# Patient Record
Sex: Female | Born: 1960 | Race: White | Hispanic: No | Smoking: Former smoker
Health system: Southern US, Community
[De-identification: ages and names within clinical notes are randomized; demographics above are authoritative.]

## PROBLEM LIST (undated history)

## (undated) DIAGNOSIS — F32A Depression, unspecified: Secondary | ICD-10-CM

## (undated) DIAGNOSIS — T7840XA Allergy, unspecified, initial encounter: Secondary | ICD-10-CM

## (undated) DIAGNOSIS — I10 Essential (primary) hypertension: Secondary | ICD-10-CM

## (undated) DIAGNOSIS — M1990 Unspecified osteoarthritis, unspecified site: Secondary | ICD-10-CM

## (undated) DIAGNOSIS — E079 Disorder of thyroid, unspecified: Secondary | ICD-10-CM

## (undated) DIAGNOSIS — D649 Anemia, unspecified: Secondary | ICD-10-CM

## (undated) DIAGNOSIS — J45909 Unspecified asthma, uncomplicated: Secondary | ICD-10-CM

## (undated) DIAGNOSIS — R569 Unspecified convulsions: Secondary | ICD-10-CM

## (undated) DIAGNOSIS — K5090 Crohn's disease, unspecified, without complications: Secondary | ICD-10-CM

## (undated) DIAGNOSIS — J439 Emphysema, unspecified: Secondary | ICD-10-CM

## (undated) DIAGNOSIS — F329 Major depressive disorder, single episode, unspecified: Secondary | ICD-10-CM

## (undated) HISTORY — DX: Unspecified osteoarthritis, unspecified site: M19.90

## (undated) HISTORY — PX: ROTATOR CUFF REPAIR: SHX139

## (undated) HISTORY — PX: OTHER SURGICAL HISTORY: SHX169

## (undated) HISTORY — DX: Unspecified convulsions: R56.9

## (undated) HISTORY — PX: CHOLECYSTECTOMY: SHX55

## (undated) HISTORY — DX: Depression, unspecified: F32.A

## (undated) HISTORY — DX: Allergy, unspecified, initial encounter: T78.40XA

## (undated) HISTORY — PX: BREAST SURGERY: SHX581

## (undated) HISTORY — DX: Disorder of thyroid, unspecified: E07.9

## (undated) HISTORY — PX: ABDOMINAL HYSTERECTOMY: SHX81

## (undated) HISTORY — PX: APPENDECTOMY: SHX54

## (undated) HISTORY — PX: IMPLANTATION / PLACEMENT EPIDURAL NEUROSTIMULATOR ELECTRODES: SUR687

## (undated) HISTORY — PX: CARPAL TUNNEL RELEASE: SHX101

## (undated) HISTORY — DX: Emphysema, unspecified: J43.9

## (undated) HISTORY — DX: Unspecified asthma, uncomplicated: J45.909

## (undated) HISTORY — DX: Anemia, unspecified: D64.9

## (undated) HISTORY — DX: Major depressive disorder, single episode, unspecified: F32.9

## (undated) HISTORY — PX: ULNAR NERVE REPAIR: SHX2594

---

## 1990-10-14 HISTORY — PX: OTHER SURGICAL HISTORY: SHX169

## 2011-10-15 HISTORY — PX: HIP ADDUCTOR TENOTOMY: SHX1747

## 2016-04-23 ENCOUNTER — Telehealth: Payer: Self-pay | Admitting: Internal Medicine

## 2016-04-23 ENCOUNTER — Encounter: Payer: Self-pay | Admitting: Internal Medicine

## 2016-04-23 ENCOUNTER — Ambulatory Visit (INDEPENDENT_AMBULATORY_CARE_PROVIDER_SITE_OTHER): Payer: Medicare Other | Admitting: Family Medicine

## 2016-04-23 ENCOUNTER — Encounter: Payer: Self-pay | Admitting: Family Medicine

## 2016-04-23 VITALS — BP 119/83 | HR 67 | Temp 97.7°F | Resp 16 | Ht 61.0 in | Wt 178.2 lb

## 2016-04-23 DIAGNOSIS — J439 Emphysema, unspecified: Secondary | ICD-10-CM | POA: Insufficient documentation

## 2016-04-23 DIAGNOSIS — Z95828 Presence of other vascular implants and grafts: Secondary | ICD-10-CM | POA: Diagnosis not present

## 2016-04-23 DIAGNOSIS — K50014 Crohn's disease of small intestine with abscess: Secondary | ICD-10-CM | POA: Diagnosis not present

## 2016-04-23 DIAGNOSIS — M792 Neuralgia and neuritis, unspecified: Secondary | ICD-10-CM | POA: Diagnosis not present

## 2016-04-23 DIAGNOSIS — K5090 Crohn's disease, unspecified, without complications: Secondary | ICD-10-CM | POA: Insufficient documentation

## 2016-04-23 DIAGNOSIS — E038 Other specified hypothyroidism: Secondary | ICD-10-CM

## 2016-04-23 DIAGNOSIS — M549 Dorsalgia, unspecified: Secondary | ICD-10-CM

## 2016-04-23 DIAGNOSIS — E034 Atrophy of thyroid (acquired): Secondary | ICD-10-CM

## 2016-04-23 DIAGNOSIS — I10 Essential (primary) hypertension: Secondary | ICD-10-CM | POA: Insufficient documentation

## 2016-04-23 DIAGNOSIS — D509 Iron deficiency anemia, unspecified: Secondary | ICD-10-CM | POA: Diagnosis not present

## 2016-04-23 DIAGNOSIS — J432 Centrilobular emphysema: Secondary | ICD-10-CM | POA: Diagnosis not present

## 2016-04-23 DIAGNOSIS — G2581 Restless legs syndrome: Secondary | ICD-10-CM

## 2016-04-23 DIAGNOSIS — G8929 Other chronic pain: Secondary | ICD-10-CM

## 2016-04-23 DIAGNOSIS — M542 Cervicalgia: Secondary | ICD-10-CM | POA: Insufficient documentation

## 2016-04-23 DIAGNOSIS — Z932 Ileostomy status: Secondary | ICD-10-CM | POA: Insufficient documentation

## 2016-04-23 DIAGNOSIS — J9611 Chronic respiratory failure with hypoxia: Secondary | ICD-10-CM | POA: Diagnosis not present

## 2016-04-23 DIAGNOSIS — K219 Gastro-esophageal reflux disease without esophagitis: Secondary | ICD-10-CM | POA: Diagnosis not present

## 2016-04-23 DIAGNOSIS — J9610 Chronic respiratory failure, unspecified whether with hypoxia or hypercapnia: Secondary | ICD-10-CM | POA: Insufficient documentation

## 2016-04-23 DIAGNOSIS — F329 Major depressive disorder, single episode, unspecified: Secondary | ICD-10-CM

## 2016-04-23 DIAGNOSIS — E039 Hypothyroidism, unspecified: Secondary | ICD-10-CM | POA: Insufficient documentation

## 2016-04-23 DIAGNOSIS — D649 Anemia, unspecified: Secondary | ICD-10-CM | POA: Insufficient documentation

## 2016-04-23 DIAGNOSIS — Z9981 Dependence on supplemental oxygen: Secondary | ICD-10-CM | POA: Insufficient documentation

## 2016-04-23 DIAGNOSIS — F32A Depression, unspecified: Secondary | ICD-10-CM

## 2016-04-23 MED ORDER — PREGABALIN 100 MG PO CAPS: 100.0000 mg | ORAL_CAPSULE | Freq: Three times a day (TID) | ORAL | Status: AC

## 2016-04-23 MED ORDER — CLONAZEPAM 0.5 MG PO TABS: 0.5000 mg | ORAL_TABLET | Freq: Two times a day (BID) | ORAL | Status: AC | PRN

## 2016-04-23 MED ORDER — SERTRALINE HCL 50 MG PO TABS: 25.0000 mg | ORAL_TABLET | Freq: Two times a day (BID) | ORAL | Status: AC

## 2016-04-23 MED ORDER — OMEPRAZOLE 20 MG PO CPDR: 20.0000 mg | DELAYED_RELEASE_CAPSULE | Freq: Every day | ORAL | Status: AC

## 2016-04-23 MED ORDER — SILVER NITRATE-POT NITRATE 75-25 % EX MISC: CUTANEOUS | Status: AC

## 2016-04-23 MED ORDER — HYDROCODONE-ACETAMINOPHEN 10-325 MG PO TABS
1.0000 | ORAL_TABLET | Freq: Three times a day (TID) | ORAL | Status: DC | PRN
Start: 2016-04-23 — End: 2016-05-02

## 2016-04-23 NOTE — Assessment & Plan Note (Signed)
Continue current regimen. Will refer to pulmonology for follow-up of her COPD.

## 2016-04-23 NOTE — Assessment & Plan Note (Signed)
Confirmed by sleep study. Pt currently on Requip and clonazepam. Consider Neupro patch or referral to neuro with refractory symptoms. Recheck 1 mos.

## 2016-04-23 NOTE — Assessment & Plan Note (Signed)
Refer to GI today for follow-up of Crohns disease. Consider surgical referral and follow-up due to multiple adhesions.

## 2016-04-23 NOTE — Assessment & Plan Note (Signed)
Check anemia panel and iron studies today.

## 2016-04-23 NOTE — Progress Notes (Signed)
Subjective:    Patient ID: Madeline Yates, female    DOB: [birth year], 55 y.o.   MRN: [id number]  HPI: Madeline Yates is a 55 y.o. female presenting on 04/23/2016 for Establish Care   HPI  Pt presents to establish care today. Previous care provider was in [place]- Dr. Jodi Geralds.  It has been 2 months since Her last PCP visit. Records from previous provider will be requested and reviewed. Current medical problems include:  COPD/Emphysema: Diagnosed in 2006. Has had O2 for 11 mos. On 2L [place]. Previously managed by pulmonology. Using combivent every 4 hours for wheezing. Uses once per day. Has been coughing some- not productive. Needs a pulmonologist.  Restless Leg syndrome: Is taking requip twice per day. Has become period limb movement disorder. Was given clonazepam- taking as needed once at bedtime. Has been taking requip 2x daily for a few years. Several medications aggravate symptoms including zyrtect and Claritin. She was previously on mirapex and gabapentin for her symptoms and failure. Symptoms are keeping her awake at night. Creepy crawly sensation in legs. Legs and tense. Can start as early as 1-2pm in afternoon. Had a sleep study to confirm.  Crohn's disease- Had ileostomy placed 25 years ago- had correction in 2000. Takes lomotil daily to help with symptoms. No crohns flare ups in several year. Did have an adhesion; excessive abdominal pain- nothing relieves it. Present for a few years. Was seeing GI is [place] for follow-up on crohn's . Has scar tissue and adhesions-3 years ago. Was not seeing a gastroenterology.  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 Has chronic hip pain. Multiple surgeries. Has a neurostimulator in her back- is MRI compatibles.  Degenerative disc disease- L6-l7 and cervical. CT scan done at [facility] last year. Previously managing pain with anti-inflammatories. With limited effects. Pain is located in neck and lower back. Neck and back pain 5/10 on a regular  basis.  Would like to try chronic pain management. Pain is currently elevated to 7/10 due to recent moving and she is requesting pain medication today.  HTN: Diagnosed 10 years ago. Taking HCTZ daily. Has not taken medications today.  Hypothyroid- developed about 11 years ago. Dosing has been stable over the past few years.  Depression: Diagnosed age 23. Has been on zoloft for 4 years. Is controlled her symptoms.  Flank pain- Comes and goes. Every 12 hours at times. Was placed on lyrica for this and some other chronic pain. It is not really helping. Would like to try a lower dose.  GERD: Had an EGD 6 mos ago Had an esophageal stricture. Is taking omeprazole daily. Symptoms are doing much better. Did find a small bolus of undigested food.  Osteopenia- down graded to osteopenia.  Surgical menopause- age 9. Is taking estrace daily for her symptoms.  Has a port-a-cath- SMART port. Was not supposed to need a flush per patient. Last accessed- 3 mos ago.    Past Medical History  Diagnosis Date  . Allergy   . Anemia   . Arthritis   . Asthma   . Depression   . Emphysema lung (HCC)   . Thyroid disease   . Seizure [facility])    Social History   Social History  . Marital Status: Divorced    Spouse Name: N/A  . Number of Children: N/A  . Years of Education: N/A   Occupational History  . Not on file.   Social History Main Topics  . Smoking status: Never Smoker   .  Smokeless tobacco: Not on file  . Alcohol Use: Yes  . Drug Use: No  . Sexual Activity: Not on file   Other Topics Concern  . Not on file   Social History Narrative  . No narrative on file   Family History  Problem Relation Age of Onset  . Depression Mother    No current outpatient prescriptions on file prior to visit.   No current facility-administered medications on file prior to visit.    Review of Systems  Constitutional: Negative for fever and chills.  HENT: Negative.   Respiratory: Positive for cough and  shortness of breath. Negative for chest tightness and wheezing.   Cardiovascular: Negative for chest pain, palpitations and leg swelling.  Gastrointestinal: Positive for abdominal pain and diarrhea. Negative for nausea, vomiting and constipation.  Endocrine: Negative.  Negative for cold intolerance, heat intolerance, polydipsia, polyphagia and polyuria.  Genitourinary: Negative for dysuria and difficulty urinating.  Musculoskeletal: Positive for back pain and neck pain.  Skin: Positive for rash.  Neurological: Negative for dizziness, light-headedness and numbness.  Psychiatric/Behavioral: Negative.  Negative for suicidal ideas, confusion and sleep disturbance.   Per HPI unless specifically indicated above     Objective:    BP 119/83 mmHg  Pulse 67  Temp(Src) 97.7 F (36.5 C) (Oral)  Resp 16  Ht 5\' 1"  (1.549 m)  Wt 178 lb 3.2 oz (80.831 kg)  BMI 33.69 kg/m2  SpO2 100%  LMP   Wt Readings from Last 3 Encounters:  04/23/16 178 lb 3.2 oz (80.831 kg)    Physical Exam  Constitutional: She is oriented to person, place, and time. She appears well-developed and well-nourished.  HENT:  Head: Normocephalic and atraumatic.  Neck: Neck supple. Muscular tenderness present. No spinous process tenderness present. Decreased range of motion (due to pain) present. No edema and no erythema present. No Brudzinski's sign and no Kernig's sign noted.  Cardiovascular: Normal rate, regular rhythm and normal heart sounds.  Exam reveals no gallop and no friction rub.   No murmur heard. Pulmonary/Chest: Effort normal and breath sounds normal. She has no wheezes. She exhibits no tenderness.  Abdominal: Soft. Normal appearance and bowel sounds are normal. She exhibits no distension and no mass. There is generalized tenderness. There is no rebound, no guarding and no CVA tenderness.    Musculoskeletal: She exhibits no edema or tenderness.  Lymphadenopathy:    She has no cervical adenopathy.  Neurological:  She is alert and oriented to person, place, and time.  Skin: Skin is warm and dry. Rash noted. There is erythema.  Excoriated area below ileostomy.    No results found for this or any previous visit.    Assessment & Plan:   Problem List Items Addressed This Visit      Cardiovascular and Mediastinum   Hypertension    Controlled. Continue current regimen.       Relevant Medications   hydrochlorothiazide (HYDRODIURIL) 25 MG tablet   Other Relevant Orders   COMPLETE METABOLIC PANEL WITH GFR   Lipid Profile     Respiratory   Emphysema lung (HCC) - Primary    Continue current regimen. Will refer to pulmonology for follow-up of her COPD.       Relevant Medications   loratadine (CLARITIN) 10 MG tablet   fluticasone (FLONASE) 50 MCG/ACT nasal spray   albuterol-ipratropium (COMBIVENT) 18-103 MCG/ACT inhaler   Other Relevant Orders   Ambulatory referral to Pulmonology   Chronic respiratory failure (HCC)    Continue home  O2 will plan to refer to Pulmonology today.         Digestive   Crohn's disease [facility])    Refer to GI today for follow-up of Crohns disease. Consider surgical referral and follow-up due to multiple adhesions.       Relevant Orders   Ambulatory referral to General Surgery   GERD (gastroesophageal reflux disease)    Recent EGD. Will continue omeprazole. Check CBC.       Relevant Medications   diphenoxylate-atropine (LOMOTIL) 2.5-0.025 MG tablet   omeprazole (PRILOSEC) 20 MG capsule     Endocrine   Hypothyroidism   Relevant Medications   levothyroxine (SYNTHROID, LEVOTHROID) 100 MCG tablet   Other Relevant Orders   TSH     Other   Depression    Controlled. Renew zoloft. Recheck 6 mos.       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 Refer to cancer center for monthly heparin flushes.       Neuropathic pain    Pt taking lyrica but no effected. Will try to lower  dose to see if symptoms remain the same.       Relevant Medications   pregabalin (LYRICA) 100 MG capsule   Anemia    Check anemia panel and iron studies today.       Relevant Orders   CBC with Differential/Platelet   Ferritin   Iron Binding Cap (TIBC)   Ileostomy in place [facility])    Placed 1992. Has supplies. Uses silver nitrate PRN. May need WOCN consult due to excoriation.       Relevant Medications   silver nitrate applicators 75-25 % applicator   Restless leg syndrome    Confirmed by sleep study. Pt currently on Requip and clonazepam. Consider Neupro patch or referral to neuro with refractory symptoms. Recheck 1 mos.       Relevant Medications   clonazePAM (KLONOPIN) 0.5 MG tablet   Chronic neck and back pain    PRN norco given for severe pain symptoms since patient cannot take NSAIDs. Refer to chronic pain for follow-up on neuro-stimulator and possible injections for her pain.       Relevant Medications   sertraline (ZOLOFT) 50 MG tablet   pregabalin (LYRICA) 100 MG capsule   clonazePAM (KLONOPIN) 0.5 MG tablet   HYDROcodone-acetaminophen (NORCO) 10-325 MG tablet   Other Relevant Orders   Ambulatory referral to Pain Clinic      Meds ordered this encounter  Medications  . loratadine (CLARITIN) 10 MG tablet    Sig: Take 10 mg by mouth daily.    Refill:  8  . rOPINIRole (REQUIP) 1 MG tablet    Sig: TAKE ONE TABLET BY MOUTH EVERY EVENING AND 1 TABLET BY MOUTH EVERY NIGHT    Refill:  0  . valACYclovir (VALTREX) 500 MG tablet    Sig: Take 500 mg by mouth daily.    Refill:  5  . DISCONTD: pregabalin (LYRICA) 150 MG capsule    Sig: Take 150 mg by mouth 3 (three) times daily.  Marland Kitchen DISCONTD: clonazePAM (KLONOPIN) 0.5 MG tablet    Sig: Take 0.5 mg by mouth 2 (two) times daily as needed for anxiety.  . pediatric multivitamin-iron (POLY-VI-SOL WITH IRON) 15 MG chewable tablet    Sig: Chew 1 tablet by mouth daily.  . Melatonin 5 MG TABS    Sig: Take by  mouth.  .  diphenoxylate-atropine (LOMOTIL) 2.5-0.025 MG tablet    Sig: Take by mouth 4 (four) times daily as needed for diarrhea or loose stools.  . fluticasone (FLONASE) 50 MCG/ACT nasal spray    Sig: Place into both nostrils daily.  Marland Kitchen lamiVUDine-zidovudine (COMBIVIR) 150-300 MG tablet    Sig: Take 1 tablet by mouth 2 (two) times daily.  Marland Kitchen albuterol-ipratropium (COMBIVENT) 18-103 MCG/ACT inhaler    Sig: Inhale 2 puffs into the lungs every 4 (four) hours.  . potassium chloride (K-DUR) 10 MEQ tablet    Sig: Take 20 mEq by mouth daily. Potassium CL 10% (20 MEQ/15) take 30 ML daily  . estradiol (ESTRACE) 0.5 MG tablet    Sig: Take 1 mg by mouth daily.  Marland Kitchen DISCONTD: sertraline (ZOLOFT) 50 MG tablet    Sig: Take 50 mg by mouth daily. Pt takes 1/2 of 50 mg tab twice daily  . hydrochlorothiazide (HYDRODIURIL) 25 MG tablet    Sig: Take 25 mg by mouth daily.  Marland Kitchen DISCONTD: omeprazole (PRILOSEC) 20 MG capsule    Sig: Take 20 mg by mouth daily.  Marland Kitchen levothyroxine (SYNTHROID, LEVOTHROID) 100 MCG tablet    Sig: Take 100 mcg by mouth daily before breakfast. Take 1 tablet every other day 25 mcg tablet  . omeprazole (PRILOSEC) 20 MG capsule    Sig: Take 1 capsule (20 mg total) by mouth daily.    Dispense:  90 capsule    Refill:  3    Order Specific Question:  Supervising Provider    Answer:  Janeann Forehand [phone]  . sertraline (ZOLOFT) 50 MG tablet    Sig: Take 0.5 tablets (25 mg total) by mouth 2 (two) times daily. Pt takes 1/2 of 50 mg tab twice daily    Dispense:  90 tablet    Refill:  3    Order Specific Question:  Supervising Provider    Answer:  Janeann Forehand [phone]  . pregabalin (LYRICA) 100 MG capsule    Sig: Take 1 capsule (100 mg total) by mouth 3 (three) times daily.    Dispense:  90 capsule    Refill:  2    Order Specific Question:  Supervising Provider    Answer:  Janeann Forehand [[id number]]  . clonazePAM (KLONOPIN) 0.5 MG tablet    Sig: Take 1 tablet (0.5 mg total) by mouth 2  (two) times daily as needed for anxiety.    Dispense:  60 tablet    Refill:  0    Order Specific Question:  Supervising Provider    Answer:  Janeann Forehand [phone]  . silver nitrate applicators 75-25 % applicator    Sig: Apply topically 3 (three) times a week.    Dispense:  10 each    Refill:  11    Order Specific Question:  Supervising Provider    Answer:  Janeann Forehand [phone]  . HYDROcodone-acetaminophen (NORCO) 10-325 MG tablet    Sig: Take 1 tablet by mouth every 8 (eight) hours as needed.    Dispense:  15 tablet    Refill:  0    Order Specific Question:  Supervising Provider    Answer:  Janeann Forehand [[id number]]      Follow up plan: Return in about 4 weeks (around 05/21/2016) for RLS. Marland Kitchen

## 2016-04-23 NOTE — Assessment & Plan Note (Signed)
Recent EGD. Will continue omeprazole. Check CBC.

## 2016-04-23 NOTE — Assessment & Plan Note (Signed)
Pt will need flushing of port. Refer to cancer center for monthly heparin flushes.

## 2016-04-23 NOTE — Assessment & Plan Note (Signed)
Pt taking lyrica but no effected. Will try to lower dose to see if symptoms remain the same.

## 2016-04-23 NOTE — Assessment & Plan Note (Signed)
Controlled. Continue current regimen. 

## 2016-04-23 NOTE — Patient Instructions (Addendum)
I would like to see you back next month to discuss your RLS. We will do labwork today.   We will refer you to pulmonology, GI, and chronic pain today. This office does not do chronic pain management- I have given you a few norco to help with current exacerbated pain, but there will be no refills on this medication.

## 2016-04-23 NOTE — Assessment & Plan Note (Signed)
Controlled. Renew zoloft. Recheck 6 mos.

## 2016-04-23 NOTE — Telephone Encounter (Signed)
Error

## 2016-04-23 NOTE — Assessment & Plan Note (Signed)
Placed 1992. Has supplies. Uses silver nitrate PRN. May need WOCN consult due to excoriation.

## 2016-04-23 NOTE — Assessment & Plan Note (Signed)
Continue home O2 will plan to refer to Pulmonology today.

## 2016-04-23 NOTE — Assessment & Plan Note (Signed)
PRN norco given for severe pain symptoms since patient cannot take NSAIDs. Refer to chronic pain for follow-up on neuro-stimulator and possible injections for her pain.

## 2016-04-24 ENCOUNTER — Other Ambulatory Visit: Payer: Self-pay | Admitting: Family Medicine

## 2016-04-24 ENCOUNTER — Telehealth: Payer: Self-pay | Admitting: Family Medicine

## 2016-04-24 LAB — CBC WITH DIFFERENTIAL/PLATELET
BASOS PCT: 0 %
Basophils Absolute: 0 cells/uL (ref 0–200)
EOS PCT: 3 %
Eosinophils Absolute: 135 cells/uL (ref 15–500)
HCT: 49.3 % — ABNORMAL HIGH (ref 35.0–45.0)
Hemoglobin: 16.7 g/dL — ABNORMAL HIGH (ref 11.7–15.5)
LYMPHS ABS: 1530 {cells}/uL (ref 850–3900)
Lymphocytes Relative: 34 %
MCH: 33.5 pg — AB (ref 27.0–33.0)
MCHC: 33.9 g/dL (ref 32.0–36.0)
MCV: 99 fL (ref 80.0–100.0)
MONO ABS: 225 {cells}/uL (ref 200–950)
MPV: 10.6 fL (ref 7.5–12.5)
Monocytes Relative: 5 %
NEUTROS ABS: 2610 {cells}/uL (ref 1500–7800)
Neutrophils Relative %: 58 %
PLATELETS: 172 10*3/uL (ref 140–400)
RBC: 4.98 MIL/uL (ref 3.80–5.10)
RDW: 14.4 % (ref 11.0–15.0)
WBC: 4.5 10*3/uL (ref 3.8–10.8)

## 2016-04-24 LAB — COMPLETE METABOLIC PANEL WITH GFR
ALT: 12 U/L (ref 6–29)
AST: 19 U/L (ref 10–35)
Albumin: 4.5 g/dL (ref 3.6–5.1)
Alkaline Phosphatase: 69 U/L (ref 33–130)
BILIRUBIN TOTAL: 0.7 mg/dL (ref 0.2–1.2)
BUN: 13 mg/dL (ref 7–25)
CHLORIDE: 98 mmol/L (ref 98–110)
CO2: 29 mmol/L (ref 20–31)
Calcium: 9.4 mg/dL (ref 8.6–10.4)
Creat: 0.87 mg/dL (ref 0.50–1.05)
GFR, EST NON AFRICAN AMERICAN: 75 mL/min (ref >=60)
GFR, Est African American: 86 mL/min (ref >=60)
GLUCOSE: 81 mg/dL (ref 65–99)
POTASSIUM: 3.3 mmol/L — AB (ref 3.5–5.3)
SODIUM: 143 mmol/L (ref 135–146)
TOTAL PROTEIN: 7.4 g/dL (ref 6.1–8.1)

## 2016-04-24 LAB — LIPID PANEL
CHOL/HDL RATIO: 2.2 ratio (ref <=5.0)
Cholesterol: 177 mg/dL (ref 125–200)
HDL: 82 mg/dL (ref >=46)
LDL CALC: 74 mg/dL (ref <130)
TRIGLYCERIDES: 104 mg/dL (ref <150)
VLDL: 21 mg/dL (ref <30)

## 2016-04-24 LAB — IRON AND TIBC
%SAT: 32 % (ref 11–50)
IRON: 133 ug/dL (ref 45–160)
TIBC: 413 ug/dL (ref 250–450)
UIBC: 280 ug/dL (ref 125–400)

## 2016-04-24 LAB — FERRITIN: FERRITIN: 92 ng/mL (ref 10–232)

## 2016-04-24 LAB — TSH: TSH: 1.61 mIU/L

## 2016-04-24 MED ORDER — POTASSIUM CHLORIDE 20 MEQ/15ML (10%) PO SOLN: 20.0000 meq | Freq: Two times a day (BID) | ORAL | Status: AC

## 2016-04-24 MED ORDER — LEVOTHYROXINE SODIUM 25 MCG PO TABS: 12.5000 ug | ORAL_TABLET | Freq: Every day | ORAL | Status: AC

## 2016-04-24 NOTE — Telephone Encounter (Signed)
Pt. Called requesting a refill on  Levothroid. 25mg . Pt call back # is  [phone]

## 2016-04-24 NOTE — Telephone Encounter (Signed)
Corrected dose and sent to pharmacy file.

## 2016-05-02 ENCOUNTER — Ambulatory Visit (INDEPENDENT_AMBULATORY_CARE_PROVIDER_SITE_OTHER): Payer: Medicare Other | Admitting: Internal Medicine

## 2016-05-02 ENCOUNTER — Telehealth: Payer: Self-pay | Admitting: Internal Medicine

## 2016-05-02 ENCOUNTER — Encounter: Payer: Self-pay | Admitting: Internal Medicine

## 2016-05-02 VITALS — BP 136/80 | HR 75 | Ht 61.0 in | Wt 174.0 lb

## 2016-05-02 DIAGNOSIS — J449 Chronic obstructive pulmonary disease, unspecified: Secondary | ICD-10-CM | POA: Diagnosis not present

## 2016-05-02 MED ORDER — FORMOTEROL FUMARATE 20 MCG/2ML IN NEBU: 20.0000 ug | INHALATION_SOLUTION | Freq: Two times a day (BID) | RESPIRATORY_TRACT | Status: DC

## 2016-05-02 MED ORDER — BUDESONIDE 0.5 MG/2ML IN SUSP: 0.5000 mg | Freq: Two times a day (BID) | RESPIRATORY_TRACT | Status: AC

## 2016-05-02 MED ORDER — GUAIFENESIN-CODEINE 100-10 MG/5ML PO SOLN: 5.0000 mL | ORAL | Status: AC | PRN

## 2016-05-02 NOTE — Telephone Encounter (Signed)
[facility] (in [place]) calling. Please resend the Rx on formoterol (PERFOROMIST) 20 MCG/2ML nebulizer solution with the ICD 10. Thanks

## 2016-05-02 NOTE — Patient Instructions (Signed)
1.recommend Pulmicort NEB twice daily 2.recommend performist NEB twice daily 3.cotninue with oxygen as needed 4.robitussin with codeine as needed for cough  Chronic Obstructive Pulmonary Disease Chronic obstructive pulmonary disease (COPD) is a common lung condition in which airflow from the lungs is limited. COPD is a general term that can be used to describe many different lung problems that limit airflow, including both chronic bronchitis and emphysema. If you have COPD, your lung function will probably never return to normal, but there are measures you can take to improve lung function and make yourself feel better. CAUSES   Smoking (common).  Exposure to secondhand smoke.  Genetic problems.  Chronic inflammatory lung diseases or recurrent infections. SYMPTOMS  Shortness of breath, especially with physical activity.  Deep, persistent (chronic) cough with a large amount of thick mucus.  Wheezing.  Rapid breaths (tachypnea).  Gray or bluish discoloration (cyanosis) of the skin, especially in your fingers, toes, or lips.  Fatigue.  Weight loss.  Frequent infections or episodes when breathing symptoms become much worse (exacerbations).  Chest tightness. DIAGNOSIS Your health care provider will take a medical history and perform a physical examination to diagnose COPD. Additional tests for COPD may include:  Lung (pulmonary) function tests.  Chest X-ray.  CT scan.  Blood tests. TREATMENT  Treatment for COPD may include:  Inhaler and nebulizer medicines. These help manage the symptoms of COPD and make your breathing more comfortable.  Supplemental oxygen. Supplemental oxygen is only helpful if you have a low oxygen level in your blood.  Exercise and physical activity. These are beneficial for nearly all people with COPD.  Lung surgery or transplant.  Nutrition therapy to gain weight, if you are underweight.  Pulmonary rehabilitation. This may involve working  with a team of health care providers and specialists, such as respiratory, occupational, and physical therapists. HOME CARE INSTRUCTIONS  Take all medicines (inhaled or pills) as directed by your health care provider.  Avoid over-the-counter medicines or cough syrups that dry up your airway (such as antihistamines) and slow down the elimination of secretions unless instructed otherwise by your health care provider.  If you are a smoker, the most important thing that you can do is stop smoking. Continuing to smoke will cause further lung damage and breathing trouble. Ask your health care provider for help with quitting smoking. He or she can direct you to community resources or hospitals that provide support.  Avoid exposure to irritants such as smoke, chemicals, and fumes that aggravate your breathing.  Use oxygen therapy and pulmonary rehabilitation if directed by your health care provider. If you require home oxygen therapy, ask your health care provider whether you should purchase a pulse oximeter to measure your oxygen level at home.  Avoid contact with individuals who have a contagious illness.  Avoid extreme temperature and humidity changes.  Eat healthy foods. Eating smaller, more frequent meals and resting before meals may help you maintain your strength.  Stay active, but balance activity with periods of rest. Exercise and physical activity will help you maintain your ability to do things you want to do.  Preventing infection and hospitalization is very important when you have COPD. Make sure to receive all the vaccines your health care provider recommends, especially the pneumococcal and influenza vaccines. Ask your health care provider whether you need a pneumonia vaccine.  Learn and use relaxation techniques to manage stress.  Learn and use controlled breathing techniques as directed by your health care provider. Controlled breathing techniques include:  Pursed lip breathing.  Start by breathing in (inhaling) through your nose for 1 second. Then, purse your lips as if you were going to whistle and breathe out (exhale) through the pursed lips for 2 seconds.  Diaphragmatic breathing. Start by putting one hand on your abdomen just above your waist. Inhale slowly through your nose. The hand on your abdomen should move out. Then purse your lips and exhale slowly. You should be able to feel the hand on your abdomen moving in as you exhale.  Learn and use controlled coughing to clear mucus from your lungs. Controlled coughing is a series of short, progressive coughs. The steps of controlled coughing are: 1. Lean your head slightly forward. 2. Breathe in deeply using diaphragmatic breathing. 3. Try to hold your breath for 3 seconds. 4. Keep your mouth slightly open while coughing twice. 5. Spit any mucus out into a tissue. 6. Rest and repeat the steps once or twice as needed. SEEK MEDICAL CARE IF:  You are coughing up more mucus than usual.  There is a change in the color or thickness of your mucus.  Your breathing is more labored than usual.  Your breathing is faster than usual. SEEK IMMEDIATE MEDICAL CARE IF:  You have shortness of breath while you are resting.  You have shortness of breath that prevents you from:  Being able to talk.  Performing your usual physical activities.  You have chest pain lasting longer than 5 minutes.  Your skin color is more cyanotic than usual.  You measure low oxygen saturations for longer than 5 minutes with a pulse oximeter. MAKE SURE YOU:  Understand these instructions.  Will watch your condition.  Will get help right away if you are not doing well or get worse.   This information is not intended to replace advice given to you by your health care provider. Make sure you discuss any questions you have with your health care provider.   Document Released: 07/10/2005 Document Revised: 10/21/2014 Document Reviewed:  05/27/2013 Elsevier Interactive Patient Education [employer]

## 2016-05-02 NOTE — Progress Notes (Signed)
[facility] Consultation      Date: 05/02/2016,   MRN# [id number] Madeline Yates [birth year] Code Status:  Code Status History    This patient does not have a recorded code status. Please follow your organizational policy for patients in this situation.     Hosp day:@LENGTHOFSTAYDAYS @ Referring MD: @     PCP:      AdmissionWeight: 174 lb (78.926 kg)                 CurrentWeight: 174 lb (78.926 kg) Madeline Yates is a 55 y.o. old female seen in consultation for wheezing/sob at the request of Amy Krebs.     CHIEF COMPLAINT:   Wheezing and SOB Wheezing and   HISTORY OF PRESENT ILLNESS   55 yo pleasant white female with chronic hypoxic resp failure with COPD, associated with dry cough for 1 year along with daily wheezing Cough happens late afternoon and evening times mostly, wheezing happens daily Patient has tried several inhaled steroids/LABA therapy and states that they do NOT help her breathing Patient has chronic DOE and has been dx with COPD in 2006 Quit tobacco use 18 years ago Has smokes 2 ppd x 20 years She has been on oxygen for 1 year now  Patient has tried spiriva in the past and has no benefit. Patient uses combivent nebulizer therapy and combivent  Inhaler as needed has been having cough Patient has h/o crohn's diease s/p ilesotomy  and has prednisone induced AVN s/p hip replacement She has no signs of infection at this time   PAST MEDICAL HISTORY   Past Medical History  Diagnosis Date  . Allergy   . Anemia   . Arthritis   . Asthma   . Depression   . Emphysema lung (HCC)   . Thyroid disease   . Seizure [facility])      SURGICAL HISTORY   Past Surgical History  Procedure Laterality Date  . Appendectomy    . Breast surgery    . Cholecystectomy    . Abdominal hysterectomy    . Hip adductor tenotomy  2013    Left hip partial 96 total 99  . Total proct cloectomy  1992  . Infusaport       smartport  . Implantation /  placement epidural neurostimulator electrodes    . Rotator cuff repair    . Carpal tunnel release    . Ulnar nerve repair       FAMILY HISTORY   Family History  Problem Relation Age of Onset  . Depression Mother      SOCIAL HISTORY   Social History  Substance Use Topics  . Smoking status: Former Smoker -- 2.00 packs/day for 23 years  . Smokeless tobacco: None     Comment: quit smoking in 1999  . Alcohol Use: Yes     Comment: occ     MEDICATIONS    Home Medication:  Current Outpatient Rx  Name  Route  Sig  Dispense  Refill  . albuterol-ipratropium (COMBIVENT) 18-103 MCG/ACT inhaler   Inhalation   Inhale 2 puffs into the lungs every 4 (four) hours.         . clonazePAM (KLONOPIN) 0.5 MG tablet   Oral   Take 1 tablet (0.5 mg total) by mouth 2 (two) times daily as needed for anxiety.   60 tablet   0   . diphenoxylate-atropine (LOMOTIL) 2.5-0.025 MG tablet   Oral   Take by mouth 4 (four) times daily as  needed for diarrhea or loose stools.         Marland Kitchen estradiol (ESTRACE) 0.5 MG tablet   Oral   Take 1 mg by mouth daily.         . fluticasone (FLONASE) 50 MCG/ACT nasal spray   Each Nare   Place into both nostrils daily.         . hydrochlorothiazide (HYDRODIURIL) 25 MG tablet   Oral   Take 25 mg by mouth daily.         Marland Kitchen lamiVUDine-zidovudine (COMBIVIR) 150-300 MG tablet   Oral   Take 1 tablet by mouth 2 (two) times daily.         Marland Kitchen levothyroxine (LEVOTHROID) 25 MCG tablet   Oral   Take 0.5 tablets (12.5 mcg total) by mouth daily before breakfast.   45 tablet   3   . Melatonin 5 MG TABS   Oral   Take by mouth.         Marland Kitchen omeprazole (PRILOSEC) 20 MG capsule   Oral   Take 1 capsule (20 mg total) by mouth daily.   90 capsule   3   . pediatric multivitamin-iron (POLY-VI-SOL WITH IRON) 15 MG chewable tablet   Oral   Chew 1 tablet by mouth daily.         . potassium chloride 20 MEQ/15ML (10%) SOLN   Oral   Take 15 mLs (20 mEq total)  by mouth 2 (two) times daily.   900 mL   11   . pregabalin (LYRICA) 100 MG capsule   Oral   Take 1 capsule (100 mg total) by mouth 3 (three) times daily.   90 capsule   2   . rOPINIRole (REQUIP) 1 MG tablet      TAKE ONE TABLET BY MOUTH EVERY EVENING AND 1 TABLET BY MOUTH EVERY NIGHT      0   . sertraline (ZOLOFT) 50 MG tablet   Oral   Take 0.5 tablets (25 mg total) by mouth 2 (two) times daily. Pt takes 1/2 of 50 mg tab twice daily   90 tablet   3   . silver nitrate applicators 75-25 % applicator   Topical   Apply topically 3 (three) times a week.   10 each   11   . valACYclovir (VALTREX) 500 MG tablet   Oral   Take 500 mg by mouth daily.      5     Current Medication:  Current outpatient prescriptions:  .  albuterol-ipratropium (COMBIVENT) 18-103 MCG/ACT inhaler, Inhale 2 puffs into the lungs every 4 (four) hours., Disp: , Rfl:  .  clonazePAM (KLONOPIN) 0.5 MG tablet, Take 1 tablet (0.5 mg total) by mouth 2 (two) times daily as needed for anxiety., Disp: 60 tablet, Rfl: 0 .  diphenoxylate-atropine (LOMOTIL) 2.5-0.025 MG tablet, Take by mouth 4 (four) times daily as needed for diarrhea or loose stools., Disp: , Rfl:  .  estradiol (ESTRACE) 0.5 MG tablet, Take 1 mg by mouth daily., Disp: , Rfl:  .  fluticasone (FLONASE) 50 MCG/ACT nasal spray, Place into both nostrils daily., Disp: , Rfl:  .  hydrochlorothiazide (HYDRODIURIL) 25 MG tablet, Take 25 mg by mouth daily., Disp: , Rfl:  .  lamiVUDine-zidovudine (COMBIVIR) 150-300 MG tablet, Take 1 tablet by mouth 2 (two) times daily., Disp: , Rfl:  .  levothyroxine (LEVOTHROID) 25 MCG tablet, Take 0.5 tablets (12.5 mcg total) by mouth daily before breakfast., Disp: 45 tablet, Rfl: 3 .  Melatonin 5 MG TABS, Take by mouth., Disp: , Rfl:  .  omeprazole (PRILOSEC) 20 MG capsule, Take 1 capsule (20 mg total) by mouth daily., Disp: 90 capsule, Rfl: 3 .  pediatric multivitamin-iron (POLY-VI-SOL WITH IRON) 15 MG chewable tablet,  Chew 1 tablet by mouth daily., Disp: , Rfl:  .  potassium chloride 20 MEQ/15ML (10%) SOLN, Take 15 mLs (20 mEq total) by mouth 2 (two) times daily., Disp: 900 mL, Rfl: 11 .  pregabalin (LYRICA) 100 MG capsule, Take 1 capsule (100 mg total) by mouth 3 (three) times daily., Disp: 90 capsule, Rfl: 2 .  rOPINIRole (REQUIP) 1 MG tablet, TAKE ONE TABLET BY MOUTH EVERY EVENING AND 1 TABLET BY MOUTH EVERY NIGHT, Disp: , Rfl: 0 .  sertraline (ZOLOFT) 50 MG tablet, Take 0.5 tablets (25 mg total) by mouth 2 (two) times daily. Pt takes 1/2 of 50 mg tab twice daily, Disp: 90 tablet, Rfl: 3 .  silver nitrate applicators 75-25 % applicator, Apply topically 3 (three) times a week., Disp: 10 each, Rfl: 11 .  valACYclovir (VALTREX) 500 MG tablet, Take 500 mg by mouth daily., Disp: , Rfl: 5    ALLERGIES   Phenergan; Gel clean; Toradol; and Ultram     REVIEW OF SYSTEMS   Review of Systems  Constitutional: Negative for fever, chills, weight loss, malaise/fatigue and diaphoresis.  HENT: Negative for congestion and hearing loss.   Eyes: Negative for blurred vision and double vision.  Respiratory: Positive for cough, shortness of breath and wheezing. Negative for hemoptysis and sputum production.   Cardiovascular: Negative for chest pain, palpitations, orthopnea and leg swelling.  Gastrointestinal: Negative for heartburn, nausea, vomiting and abdominal pain.  Genitourinary: Negative for dysuria and urgency.  Musculoskeletal: Negative for back pain.  Skin: Negative for rash.  Neurological: Negative for dizziness, weakness and headaches.  Endo/Heme/Allergies: Does not bruise/bleed easily.  Psychiatric/Behavioral: Negative for depression. The patient is not nervous/anxious.   All other systems reviewed and are negative.    VS: BP 136/80 mmHg  Pulse 75  Ht 5\' 1"  (1.549 m)  Wt 174 lb (78.926 kg)  BMI 32.89 kg/m2  SpO2 95%     PHYSICAL EXAM  Physical Exam  Constitutional: She is oriented to person,  place, and time. No distress.  HENT:  Head: Normocephalic and atraumatic.  Mouth/Throat: No oropharyngeal exudate.  Eyes: EOM are normal. Pupils are equal, round, and reactive to light. No scleral icterus.  Neck: Normal range of motion. Neck supple.  Cardiovascular: Normal rate, regular rhythm and normal heart sounds.   No murmur heard. Pulmonary/Chest: No stridor. No respiratory distress. She has no wheezes.  Abdominal: Soft. Bowel sounds are normal.  Musculoskeletal: Normal range of motion. She exhibits no edema.  Neurological: She is alert and oriented to person, place, and time. No cranial nerve deficit.  Skin: Skin is warm. She is not diaphoretic.  Psychiatric: She has a normal mood and affect.        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   Patient satisfied with Plan of action and management. All questions answered  Lucie Leather, M.D.  [facility]  Medical Director [facility] Medical Director [facility] Cardio-Pulmonary Department

## 2016-05-03 NOTE — Telephone Encounter (Signed)
rx has been resent to [facility] with dx code. Nothing further needed.

## 2016-05-06 ENCOUNTER — Emergency Department
Admission: EM | Admit: 2016-05-06 | Discharge: 2016-05-07 | Disposition: A | Discharge status: Home / Self Care | Payer: Medicare Other | Attending: Emergency Medicine | Admitting: Emergency Medicine

## 2016-05-06 ENCOUNTER — Emergency Department: Payer: Medicare Other

## 2016-05-06 ENCOUNTER — Encounter: Payer: Self-pay | Admitting: Emergency Medicine

## 2016-05-06 DIAGNOSIS — E039 Hypothyroidism, unspecified: Secondary | ICD-10-CM | POA: Diagnosis not present

## 2016-05-06 DIAGNOSIS — M1990 Unspecified osteoarthritis, unspecified site: Secondary | ICD-10-CM | POA: Insufficient documentation

## 2016-05-06 DIAGNOSIS — J449 Chronic obstructive pulmonary disease, unspecified: Secondary | ICD-10-CM | POA: Diagnosis not present

## 2016-05-06 DIAGNOSIS — Z79899 Other long term (current) drug therapy: Secondary | ICD-10-CM | POA: Insufficient documentation

## 2016-05-06 DIAGNOSIS — R110 Nausea: Secondary | ICD-10-CM | POA: Insufficient documentation

## 2016-05-06 DIAGNOSIS — I10 Essential (primary) hypertension: Secondary | ICD-10-CM | POA: Insufficient documentation

## 2016-05-06 DIAGNOSIS — R0602 Shortness of breath: Secondary | ICD-10-CM | POA: Insufficient documentation

## 2016-05-06 DIAGNOSIS — J45909 Unspecified asthma, uncomplicated: Secondary | ICD-10-CM | POA: Diagnosis not present

## 2016-05-06 DIAGNOSIS — Z7951 Long term (current) use of inhaled steroids: Secondary | ICD-10-CM | POA: Diagnosis not present

## 2016-05-06 DIAGNOSIS — Z8719 Personal history of other diseases of the digestive system: Secondary | ICD-10-CM | POA: Insufficient documentation

## 2016-05-06 DIAGNOSIS — Z87891 Personal history of nicotine dependence: Secondary | ICD-10-CM | POA: Diagnosis not present

## 2016-05-06 DIAGNOSIS — R079 Chest pain, unspecified: Secondary | ICD-10-CM | POA: Diagnosis present

## 2016-05-06 HISTORY — DX: Essential (primary) hypertension: I10

## 2016-05-06 LAB — COMPREHENSIVE METABOLIC PANEL
ALK PHOS: 76 U/L (ref 38–126)
ALT: 14 U/L (ref 14–54)
ANION GAP: 9 (ref 5–15)
AST: 24 U/L (ref 15–41)
Albumin: 4.3 g/dL (ref 3.5–5.0)
BUN: 15 mg/dL (ref 6–20)
CALCIUM: 8.7 mg/dL — AB (ref 8.9–10.3)
CO2: 23 mmol/L (ref 22–32)
CREATININE: 0.88 mg/dL (ref 0.44–1.00)
Chloride: 106 mmol/L (ref 101–111)
Glucose, Bld: 100 mg/dL — ABNORMAL HIGH (ref 65–99)
Potassium: 3.3 mmol/L — ABNORMAL LOW (ref 3.5–5.1)
Sodium: 138 mmol/L (ref 135–145)
TOTAL PROTEIN: 7.3 g/dL (ref 6.5–8.1)
Total Bilirubin: 0.1 mg/dL — ABNORMAL LOW (ref 0.3–1.2)

## 2016-05-06 LAB — CBC
HCT: 47.6 % — ABNORMAL HIGH (ref 35.0–47.0)
HEMOGLOBIN: 16.7 g/dL — AB (ref 12.0–16.0)
MCH: 34.4 pg — AB (ref 26.0–34.0)
MCHC: 35 g/dL (ref 32.0–36.0)
MCV: 98.3 fL (ref 80.0–100.0)
Platelets: 128 10*3/uL — ABNORMAL LOW (ref 150–440)
RBC: 4.84 MIL/uL (ref 3.80–5.20)
RDW: 13.7 % (ref 11.5–14.5)
WBC: 4.9 10*3/uL (ref 3.6–11.0)

## 2016-05-06 LAB — TROPONIN I

## 2016-05-06 MED ORDER — ASPIRIN 81 MG PO CHEW
CHEWABLE_TABLET | ORAL | Status: AC
  Administered 2016-05-06: 324 mg via ORAL
  Filled 2016-05-06: qty 4

## 2016-05-06 MED ORDER — ONDANSETRON 4 MG PO TBDP
4.0000 mg | ORAL_TABLET | Freq: Once | ORAL | Status: AC
Start: 2016-05-07 — End: 2016-05-06
  Administered 2016-05-06: 4 mg via ORAL
  Filled 2016-05-06: qty 1

## 2016-05-06 MED ORDER — NITROGLYCERIN 0.4 MG SL SUBL
SUBLINGUAL_TABLET | SUBLINGUAL | Status: AC
  Administered 2016-05-06: 0.4 mg via SUBLINGUAL
  Filled 2016-05-06: qty 1

## 2016-05-06 MED ORDER — ASPIRIN 81 MG PO CHEW
324.0000 mg | CHEWABLE_TABLET | Freq: Once | ORAL | Status: AC
Start: 2016-05-06 — End: 2016-05-06
  Administered 2016-05-06: 324 mg via ORAL

## 2016-05-06 MED ORDER — OXYCODONE-ACETAMINOPHEN 5-325 MG PO TABS
ORAL_TABLET | ORAL | Status: AC
  Filled 2016-05-06: qty 1

## 2016-05-06 MED ORDER — NITROGLYCERIN 0.4 MG SL SUBL
0.4000 mg | SUBLINGUAL_TABLET | SUBLINGUAL | Status: DC | PRN
  Administered 2016-05-06: 0.4 mg via SUBLINGUAL

## 2016-05-06 MED ORDER — OXYCODONE-ACETAMINOPHEN 5-325 MG PO TABS
1.0000 | ORAL_TABLET | Freq: Once | ORAL | Status: AC
  Administered 2016-05-06: 1 via ORAL
  Filled 2016-05-06: qty 1

## 2016-05-06 NOTE — ED Provider Notes (Signed)
[facility] Emergency Department Provider Note   ____________________________________________  Time seen: Approximately 2140 PM  I have reviewed the triage vital signs and the nursing notes.   HISTORY  Chief Complaint Abdominal Pain; Chest Pain; and Shortness of Breath  HPI Madeline Yates is a 55 y.o. female with a history of Crohn's disease status post colon resection was presenting with 2 hours of 7 out of 10 central chest pain to the left chest radiating around to the left axilla as well as left side of her neck. She says that the pain is associated with nausea but not shortness of breath. Had an episode of similar pain one year ago. Denies having a stress test or catheterization. Says that she just moved here from [place] about a month ago. Denies any history of heart disease. Remote history of smoking but says she hasn't smoked for 18 years. Does wear 2 L of nasal cannula oxygen at her baseline. Says that she also has intermittent pain to her lower abdomen since he has had a colon resection which is her baseline level of pain. No changes in her lower abdominal pain. Has an ostomy.  Says that the chest pain is intermittent.   Past Medical History:  Diagnosis Date  . Allergy   . Anemia   . Arthritis   . Asthma   . Depression   . Emphysema lung (HCC)   . Seizure (HCC)   . Thyroid disease     Patient Active Problem List   Diagnosis Date Noted  . COPD (chronic obstructive pulmonary disease) (HCC) 05/02/2016  . Emphysema lung (HCC) 04/23/2016  . Depression 04/23/2016  . Hypothyroidism 04/23/2016  . Crohn's disease (HCC) 04/23/2016  . Hypertension 04/23/2016  . Chronic respiratory failure (HCC) 04/23/2016  . On home O2 04/23/2016  . Port-a-cath in place 04/23/2016  . Neuropathic pain 04/23/2016  . Anemia 04/23/2016  . Ileostomy in place [facility]) 04/23/2016  . GERD (gastroesophageal reflux disease) 04/23/2016  . Restless leg syndrome 04/23/2016   . Chronic neck and back pain 04/23/2016    Past Surgical History:  Procedure Laterality Date  . ABDOMINAL HYSTERECTOMY    . APPENDECTOMY    . BREAST SURGERY    . CARPAL TUNNEL RELEASE    . CHOLECYSTECTOMY    . HIP ADDUCTOR TENOTOMY  2013   Left hip partial 96 total 99  . IMPLANTATION / PLACEMENT EPIDURAL NEUROSTIMULATOR ELECTRODES    . infusaport      smartport  . ROTATOR CUFF REPAIR    . total proct cloectomy  1992  . ULNAR NERVE REPAIR      Current Outpatient Rx  . Order #: [id number] Class: Historical Med  . Order #: [id number] Class: Normal  . Order #: [id number] Class: Print  . Order #: [id number] Class: Historical Med  . Order #: [id number] Class: Historical Med  . Order #: [id number] Class: Historical Med  . Order #: [id number] Class: Normal  . Order #: [id number] Class: Print  . Order #: [id number] Class: Historical Med  . Order #: [id number] Class: Historical Med  . Order #: [id number] Class: Normal  . Order #: [id number] Class: Historical Med  . Order #: [id number] Class: Normal  . Order #: [id number] Class: Historical Med  . Order #: [id number] Class: Normal  . Order #: [id number] Class: Print  . Order #: [id number] Class: Historical Med  . Order #: [id number] Class: Normal  . Order #: [id number] Class: Normal  . Order #: [id number] Class: Historical Med    Allergies Phenergan [promethazine hcl]; Gel clean [thimerosal]; Toradol [ketorolac tromethamine]; and Ultram Marcia Brash  hcl]  Family History  Problem Relation Age of Onset  . Depression Mother     Social History Social History  Substance Use Topics  . Smoking status: Former Smoker    Packs/day: 2.00    Years: 23.00  . Smokeless tobacco: Never Used     Comment: quit smoking in 1999  . Alcohol use Yes     Comment: occ    Review of Systems onstitutional: No fever/chills Eyes: No visual changes. ENT: No sore throat. Cardiovascular: As above Respiratory: Says increased shortness of breath even with her oxygen. Gastrointestinal:    No nausea, no vomiting.  No diarrhea.  No constipation. Genitourinary: Negative for dysuria. Musculoskeletal: Negative for back pain. Skin: Negative for rash. Neurological: Negative for headaches, focal weakness or numbness.  10-point ROS otherwise negative.  ____________________________________________   PHYSICAL EXAM:  VITAL SIGNS: ED Triage Vitals  Enc Vitals Group     BP 05/06/16 1952 129/73     Pulse Rate 05/06/16 1952 77     Resp 05/06/16 1952 20     Temp 05/06/16 1952 98 F (36.7 C)     Temp Source 05/06/16 1952 Oral     SpO2 05/06/16 1949 100 %     Weight 05/06/16 1952 175 lb (79.4 kg)     Height 05/06/16 1952 5\' 1"  (1.549 m)     Head Circumference --      Peak Flow --      Pain Score 05/06/16 1953 7     Pain Loc --      Pain Edu? --      Excl. in GC? --     Constitutional: Alert and oriented. Well appearing and in no acute distress. Eyes: Conjunctivae are normal. PERRL. EOMI. Head: Atraumatic. Nose: No congestion/rhinnorhea. Mouth/Throat: Mucous membranes are moist.   Neck: No stridor.   Cardiovascular: Normal rate, regular rhythm. Grossly normal heart sounds.  Good peripheral circulation With intact and equal pulses in the radial pulses as well as dorsalis pedis. Chest pain mildly reproducible to the left side of chest.  Respiratory: Normal respiratory effort.  No retractions. Lungs CTAB. Gastrointestinal: Soft and nontender. No distention. Ostomy with brown stool to left lower abdomen. Musculoskeletal: No lower extremity tenderness nor edema.  No joint effusions. Neurologic:  Normal speech and language. No gross focal neurologic deficits are appreciated.  Skin:  Skin is warm, dry and intact. No rash noted. Psychiatric: Mood and affect are normal. Speech and behavior are normal.  ____________________________________________   LABS (all labs ordered are listed, but only abnormal results are displayed)  Labs Reviewed  CBC - Abnormal; Notable for the  following:       Result Value   Hemoglobin 16.7 (*)    HCT 47.6 (*)    MCH 34.4 (*)    Platelets 128 (*)    All other components within normal limits  COMPREHENSIVE METABOLIC PANEL - Abnormal; Notable for the following:    Potassium 3.3 (*)    Glucose, Bld 100 (*)    Calcium 8.7 (*)    Total Bilirubin 0.1 (*)    All other components within normal limits  TROPONIN I  TROPONIN I  FIBRIN DERIVATIVES D-DIMER ([facility] ONLY)   ____________________________________________  EKG  ED ECG REPORT I, Arelia Longest, the attending physician, personally viewed and interpreted this ECG.   Date: 05/06/2016  EKG Time: 1955  Rate: 71  Rhythm: normal sinus rhythm  Axis: Normal  Intervals:none  ST&T Change: No ST segment  elevation or depression. No abnormal T-wave inversion.  ____________________________________________  RADIOLOGY CLINICAL DATA:  Chest pain and shortness of breath for 2 hours. EXAM: CHEST  2 VIEW COMPARISON:  None. FINDINGS: The Port-A-Cath tip is in the distal SVC near the cavoatrial junction. Two spinal cord stimulators are noted. The cardiac silhouette, mediastinal and hilar contours are within normal limits. The lungs demonstrate streaky bibasilar scarring or atelectasis. No infiltrates, edema or effusions. The bony thorax is intact. IMPRESSION: Streaky bibasilar atelectasis or scarring. No infiltrates, edema or effusions. Electronically Signed   By: Rudie Meyer M.D.   On: 05/06/2016 20:18  ____________________________________________   PROCEDURES   Procedures   ____________________________________________   INITIAL IMPRESSION / ASSESSMENT AND PLAN / ED COURSE  Pertinent labs & imaging results that were available during my care of the patient were reviewed by me and considered in my medical decision making (see chart for details).  ----------------------------------------- 11:31 PM on  05/06/2016 -----------------------------------------  No relief with nitroglycerin. Patient also still experiencing left sided chest pain as well as shortness of breath after Percocet. 2 negative troponins. Denied any worsening of the pain with deep breathing but possible PE especially without resolution of pain with opiate pain medications. Will proceed with d-dimer. Signed out to Dr. Zenda Alpers.  Clinical Course     ____________________________________________   FINAL CLINICAL IMPRESSION(S) / ED DIAGNOSES  Chest pain. Shortness of breath.    NEW MEDICATIONS STARTED DURING THIS VISIT:  New Prescriptions   No medications on file     Note:  This document was prepared using Dragon voice recognition software and may include unintentional dictation errors.    Myrna Blazer, MD 05/06/16 [phone]

## 2016-05-07 ENCOUNTER — Emergency Department: Payer: Medicare Other

## 2016-05-07 LAB — FIBRIN DERIVATIVES D-DIMER (ARMC ONLY): FIBRIN DERIVATIVES D-DIMER (ARMC): 1394 — AB (ref 0–499)

## 2016-05-07 MED ORDER — HEPARIN SOD (PORK) LOCK FLUSH 100 UNIT/ML IV SOLN
500.0000 [IU] | Freq: Once | INTRAVENOUS | Status: AC
  Administered 2016-05-07: 500 [IU] via INTRAVENOUS
  Filled 2016-05-07: qty 5

## 2016-05-07 MED ORDER — IOPAMIDOL (ISOVUE-370) INJECTION 76%
100.0000 mL | Freq: Once | INTRAVENOUS | Status: AC | PRN
  Administered 2016-05-07: 100 mL via INTRAVENOUS

## 2016-05-07 MED ORDER — ONDANSETRON HCL 4 MG/2ML IJ SOLN
4.0000 mg | Freq: Once | INTRAMUSCULAR | Status: AC
  Administered 2016-05-07: 4 mg via INTRAVENOUS
  Filled 2016-05-07: qty 2

## 2016-05-07 MED ORDER — MORPHINE SULFATE (PF) 4 MG/ML IV SOLN
4.0000 mg | Freq: Once | INTRAVENOUS | Status: AC
  Administered 2016-05-07: 4 mg via INTRAVENOUS
  Filled 2016-05-07: qty 1

## 2016-05-07 NOTE — ED Provider Notes (Signed)
-----------------------------------------   4:54 AM on 05/07/2016 -----------------------------------------   Blood pressure 113/84, pulse 70, temperature 98 F (36.7 C), temperature source Oral, resp. rate 15, height 5\' 1"  (1.549 m), weight 175 lb (79.4 kg), SpO2 98 %.  Assuming care from Dr. Pershing Proud.  In short, Madeline Yates is a 55 y.o. female with a chief complaint of Abdominal Pain; Chest Pain; and Shortness of Breath .  Refer to the original H&P for additional details.  The current plan of care is to follow-up the results of the patient's d-dimer.  While the patient was here she was having some pain so I did give her dose of morphine for pain. The patient's d-dimer returned elevated at 1394 so the decision was made at this time to perform a CT angiogram of the patient's chest.   CT angio chest: No acute intrathoracic pathology. No CT evidence of pulmonary embolism.  The patient's CT scan returned negative. I informed the patient of the results. I informed her that she should follow back up with her doctor and she said that she has an appointment tomorrow. I will discharge the patient home and have her follow back up with her primary care physician. The patient's pain is improved when she's having no other concerns at this time.   Rebecka Apley, MD 05/07/16 [phone]

## 2016-05-08 ENCOUNTER — Ambulatory Visit (INDEPENDENT_AMBULATORY_CARE_PROVIDER_SITE_OTHER): Payer: Medicare Other | Admitting: Family Medicine

## 2016-05-08 VITALS — BP 125/85 | HR 84 | Temp 97.8°F | Resp 16 | Ht 61.0 in | Wt 172.8 lb

## 2016-05-08 DIAGNOSIS — R109 Unspecified abdominal pain: Secondary | ICD-10-CM | POA: Diagnosis not present

## 2016-05-08 DIAGNOSIS — E876 Hypokalemia: Secondary | ICD-10-CM

## 2016-05-08 DIAGNOSIS — R1084 Generalized abdominal pain: Secondary | ICD-10-CM

## 2016-05-08 DIAGNOSIS — D696 Thrombocytopenia, unspecified: Secondary | ICD-10-CM | POA: Insufficient documentation

## 2016-05-08 DIAGNOSIS — G2581 Restless legs syndrome: Secondary | ICD-10-CM | POA: Diagnosis not present

## 2016-05-08 DIAGNOSIS — G4761 Periodic limb movement disorder: Secondary | ICD-10-CM | POA: Diagnosis not present

## 2016-05-08 LAB — CBC WITH DIFFERENTIAL/PLATELET
BASOS PCT: 0 %
Basophils Absolute: 0 cells/uL (ref 0–200)
EOS ABS: 86 {cells}/uL (ref 15–500)
EOS PCT: 2 %
HCT: 45.6 % — ABNORMAL HIGH (ref 35.0–45.0)
Hemoglobin: 15.6 g/dL — ABNORMAL HIGH (ref 11.7–15.5)
LYMPHS ABS: 1677 {cells}/uL (ref 850–3900)
Lymphocytes Relative: 39 %
MCH: 33.1 pg — ABNORMAL HIGH (ref 27.0–33.0)
MCHC: 34.2 g/dL (ref 32.0–36.0)
MCV: 96.8 fL (ref 80.0–100.0)
MONOS PCT: 6 %
MPV: 10.5 fL (ref 7.5–12.5)
Monocytes Absolute: 258 cells/uL (ref 200–950)
NEUTROS ABS: 2279 {cells}/uL (ref 1500–7800)
Neutrophils Relative %: 53 %
PLATELETS: 149 10*3/uL (ref 140–400)
RBC: 4.71 MIL/uL (ref 3.80–5.10)
RDW: 14 % (ref 11.0–15.0)
WBC: 4.3 10*3/uL (ref 3.8–10.8)

## 2016-05-08 MED ORDER — HYDROCODONE-ACETAMINOPHEN 5-325 MG PO TABS: 1.0000 | ORAL_TABLET | Freq: Four times a day (QID) | ORAL | 0 refills | Status: DC | PRN

## 2016-05-08 MED ORDER — ROPINIROLE HCL 1 MG PO TABS: 1.0000 mg | ORAL_TABLET | Freq: Two times a day (BID) | ORAL | 11 refills | Status: AC

## 2016-05-08 NOTE — Assessment & Plan Note (Signed)
Found in ER. Recheck CBC today. Pt does have scattered purpura on the arms. Could be medication or disease related. Consider hematology referral for continued or progressive low platelet count.

## 2016-05-08 NOTE — Assessment & Plan Note (Signed)
Discuss rotigotine patch- patch was reluctant 2/2 side effects. Will plan on neurology referral for refractory RLS.

## 2016-05-08 NOTE — Patient Instructions (Signed)
I think your chest pain is musculoskeletal related. They ruled out heart attack and blot clot in the ER. Take vicodin as needed for pain. If you have severe chest pain, shortness of breath, numbness or tingling on one side of the body- please go to the ER.   We will have you seen by neurology for your restless leg syndrome.

## 2016-05-08 NOTE — Progress Notes (Signed)
Subjective:    Patient ID: Madeline Yates, female    DOB: 02/27/1961, 55 y.o.   MRN: [id number]  HPI: Madeline Yates is a 55 y.o. female presenting on 05/08/2016 for Leg Pain (restless leg syndrom, gets bruce up onset 1 year, lower phantom  pain, her d-dimer positive got CT done was negative )   HPI  Pt presents for follow-up of ER hospitalization for chest pain, shortness of breath and abdominal pain. CTA r/o PE. Pt is still having lower abdominal pain and L sided pain in the axilla. Shortness of breath has resolved.  Pt has f/u with Dr. Servando Snare for abdominal pain. Still having lower abdominal pain. Has had multiple GI surgeries and lots of scar tissue.  Pt is concerned about purpura and easy bruising. Scattered purpura forearms. Easy bruising noted over several months.  RLS- worsening. Symptoms waking her up at night. Taking requip twice daily to help with symptoms. Also taking clonazepam.  Past Medical History:  Diagnosis Date  . Allergy   . Anemia   . Arthritis   . Asthma   . Depression   . Emphysema lung (HCC)   . Hypertension   . Seizure (HCC)   . Thyroid disease     Current Outpatient Prescriptions on File Prior to Visit  Medication Sig  . albuterol-ipratropium (COMBIVENT) 18-103 MCG/ACT inhaler Inhale 2 puffs into the lungs every 4 (four) hours.  . budesonide (PULMICORT) 0.5 MG/2ML nebulizer solution Take 2 mLs (0.5 mg total) by nebulization 2 (two) times daily.  . clonazePAM (KLONOPIN) 0.5 MG tablet Take 1 tablet (0.5 mg total) by mouth 2 (two) times daily as needed for anxiety.  . diphenoxylate-atropine (LOMOTIL) 2.5-0.025 MG tablet Take by mouth 4 (four) times daily as needed for diarrhea or loose stools.  Marland Kitchen estradiol (ESTRACE) 0.5 MG tablet Take 1 mg by mouth daily.  . fluticasone (FLONASE) 50 MCG/ACT nasal spray Place into both nostrils daily.  . formoterol (PERFOROMIST) 20 MCG/2ML nebulizer solution Take 2 mLs (20 mcg total) by nebulization 2 (two) times daily.  Marland Kitchen  guaiFENesin-codeine 100-10 MG/5ML syrup Take 5 mLs by mouth every 4 (four) hours as needed for cough.  . hydrochlorothiazide (HYDRODIURIL) 25 MG tablet Take 25 mg by mouth daily.  Marland Kitchen lamiVUDine-zidovudine (COMBIVIR) 150-300 MG tablet Take 1 tablet by mouth 2 (two) times daily.  Marland Kitchen levothyroxine (LEVOTHROID) 25 MCG tablet Take 0.5 tablets (12.5 mcg total) by mouth daily before breakfast.  . Melatonin 5 MG TABS Take by mouth.  Marland Kitchen omeprazole (PRILOSEC) 20 MG capsule Take 1 capsule (20 mg total) by mouth daily.  . pediatric multivitamin-iron (POLY-VI-SOL WITH IRON) 15 MG chewable tablet Chew 1 tablet by mouth daily.  . potassium chloride 20 MEQ/15ML (10%) SOLN Take 15 mLs (20 mEq total) by mouth 2 (two) times daily.  . pregabalin (LYRICA) 100 MG capsule Take 1 capsule (100 mg total) by mouth 3 (three) times daily.  . sertraline (ZOLOFT) 50 MG tablet Take 0.5 tablets (25 mg total) by mouth 2 (two) times daily. Pt takes 1/2 of 50 mg tab twice daily  . silver nitrate applicators 75-25 % applicator Apply topically 3 (three) times a week.  . valACYclovir (VALTREX) 500 MG tablet Take 500 mg by mouth daily.   No current facility-administered medications on file prior to visit.     Review of Systems  Constitutional: Negative for chills and fatigue.  HENT: Negative.   Respiratory: Negative for chest tightness, shortness of breath and wheezing.   Cardiovascular: Negative for  chest pain, palpitations and leg swelling.  Gastrointestinal: Positive for abdominal pain. Negative for diarrhea, rectal pain and vomiting.  Genitourinary: Positive for flank pain.  Hematological: Bruises/bleeds easily.   Per HPI unless specifically indicated above     Objective:    BP 125/85 (BP Location: Left Arm, Patient Position: Sitting, Cuff Size: Normal)   Pulse 84   Temp 97.8 F (36.6 C) (Oral)   Resp 16   Ht  (1.549 m)   Wt 172 lb 12.8 oz (78.4 kg)   LMP  (Exact Date)   SpO2 98%   BMI 32.65 kg/m   Wt  Readings from Last 3 Encounters:  05/08/16 172 lb 12.8 oz (78.4 kg)  05/06/16 175 lb (79.4 kg)  05/02/16 174 lb (78.9 kg)    Physical Exam  Constitutional: She is oriented to person, place, and time. She appears well-developed and well-nourished.  HENT:  Head: Normocephalic and atraumatic.  Neck: Neck supple.  Cardiovascular: Normal rate, regular rhythm and normal heart sounds.  Exam reveals no gallop and no friction rub.   No murmur heard. Pulmonary/Chest: Effort normal and breath sounds normal. She has no wheezes. Chest wall is not dull to percussion. She exhibits tenderness. She exhibits no mass, no crepitus, no edema and no deformity.    Abdominal: Soft. Normal appearance and bowel sounds are normal. She exhibits no distension and no mass. There is no hepatosplenomegaly. There is tenderness in the right lower quadrant, left upper quadrant and left lower quadrant. There is no rebound, no guarding and no CVA tenderness.  Musculoskeletal: Normal range of motion. She exhibits no edema or tenderness.  Lymphadenopathy:    She has no cervical adenopathy.  Neurological: She is alert and oriented to person, place, and time.  Skin: Skin is warm and dry.   Results for orders placed or performed during the hospital encounter of 05/06/16  CBC  Result Value Ref Range   WBC 4.9 3.6 - 11.0 K/uL   RBC 4.84 3.80 - 5.20 MIL/uL   Hemoglobin 16.7 (H) 12.0 - 16.0 g/dL   HCT 16.1 (H) 09.6 - 04.5 %   MCV 98.3 80.0 - 100.0 fL   MCH 34.4 (H) 26.0 - 34.0 pg   MCHC 35.0 32.0 - 36.0 g/dL   RDW 40.9 81.1 - 91.4 %   Platelets 128 (L) 150 - 440 K/uL  Comprehensive metabolic panel  Result Value Ref Range   Sodium 138 135 - 145 mmol/L   Potassium 3.3 (L) 3.5 - 5.1 mmol/L   Chloride 106 101 - 111 mmol/L   CO2 23 22 - 32 mmol/L   Glucose, Bld 100 (H) 65 - 99 mg/dL   BUN 15 6 - 20 mg/dL   Creatinine, Ser 7.82 0.44 - 1.00 mg/dL   Calcium 8.7 (L) 8.9 - 10.3 mg/dL   Total Protein 7.3 6.5 - 8.1 g/dL    Albumin 4.3 3.5 - 5.0 g/dL   AST 24 15 - 41 U/L   ALT 14 14 - 54 U/L   Alkaline Phosphatase 76 38 - 126 U/L   Total Bilirubin 0.1 (L) 0.3 - 1.2 mg/dL   GFR calc non Af Amer >60 >60 mL/min   GFR calc Af Amer >60 >60 mL/min   Anion gap 9 5 - 15  Troponin I  Result Value Ref Range   Troponin I <0.03 <0.03 ng/mL  Troponin I  Result Value Ref Range   Troponin I <0.03 <0.03 ng/mL  Fibrin derivatives D-Dimer ([facility] only)  Result Value Ref Range   Fibrin derivatives D-dimer ([facility]) 1,394 (H) 0 - 499      Assessment & Plan:   Problem List Items Addressed This Visit      Other   Restless leg syndrome - Primary    Discuss rotigotine patch- patch was reluctant 2/2 side effects. Will plan on neurology referral for refractory RLS.      Relevant Orders   Ambulatory referral to Neurology   Periodic limb movement disorder (PLMD)   Relevant Orders   Ambulatory referral to Neurology   Thrombocytopenia (HCC)    Found in ER. Recheck CBC today. Pt does have scattered purpura on the arms. Could be medication or disease related. Consider hematology referral for continued or progressive low platelet count.       Relevant Orders   CBC with Differential    Other Visit Diagnoses    Left flank pain       Likely MSK pain 2/2 local tenderness. PRN pain medication. Heat massage, sparing NSAIDs. Return if not improving.    Relevant Medications   HYDROcodone-acetaminophen (NORCO/VICODIN) 5-325 MG tablet   Generalized abdominal pain       Likely 2/2 crohns or nerve pain from multiple surgeries. Continue with GI follow-up.    Relevant Medications   HYDROcodone-acetaminophen (NORCO/VICODIN) 5-325 MG tablet   Hypokalemia       Recheck K levels. Continue potassium.    Relevant Orders   BASIC METABOLIC PANEL WITH GFR      Meds ordered this encounter  Medications  . estradiol (ESTRACE) 1 MG tablet    Sig: Take 1 mg by mouth daily.    Refill:  6  . rOPINIRole (REQUIP) 1 MG tablet    Sig: Take 1  tablet (1 mg total) by mouth 2 (two) times daily.    Dispense:  60 tablet    Refill:  11    Order Specific Question:   Supervising Provider    Answer:   Janeann Forehand [phone]  . HYDROcodone-acetaminophen (NORCO/VICODIN) 5-325 MG tablet    Sig: Take 1 tablet by mouth every 6 (six) hours as needed for moderate pain.    Dispense:  20 tablet    Refill:  0    Order Specific Question:   Supervising Provider    Answer:   Janeann Forehand [[id number]]      Follow up plan: Return in about 4 weeks (around 06/05/2016) for Platelet count. Marland Kitchen

## 2016-05-09 LAB — BASIC METABOLIC PANEL WITH GFR
BUN: 8 mg/dL (ref 7–25)
CALCIUM: 9.2 mg/dL (ref 8.6–10.4)
CHLORIDE: 101 mmol/L (ref 98–110)
CO2: 25 mmol/L (ref 20–31)
Creat: 0.79 mg/dL (ref 0.50–1.05)
GFR, Est Non African American: 84 mL/min (ref >=60)
Glucose, Bld: 90 mg/dL (ref 65–99)
POTASSIUM: 3.8 mmol/L (ref 3.5–5.3)
SODIUM: 138 mmol/L (ref 135–146)

## 2016-05-12 ENCOUNTER — Encounter: Payer: Self-pay | Admitting: Emergency Medicine

## 2016-05-12 ENCOUNTER — Emergency Department
Admission: EM | Admit: 2016-05-12 | Discharge: 2016-05-13 | Disposition: A | Discharge status: Home / Self Care | Payer: Medicare Other | Attending: Emergency Medicine | Admitting: Emergency Medicine

## 2016-05-12 DIAGNOSIS — I10 Essential (primary) hypertension: Secondary | ICD-10-CM | POA: Diagnosis not present

## 2016-05-12 DIAGNOSIS — E876 Hypokalemia: Secondary | ICD-10-CM

## 2016-05-12 DIAGNOSIS — E039 Hypothyroidism, unspecified: Secondary | ICD-10-CM | POA: Insufficient documentation

## 2016-05-12 DIAGNOSIS — J449 Chronic obstructive pulmonary disease, unspecified: Secondary | ICD-10-CM | POA: Diagnosis not present

## 2016-05-12 DIAGNOSIS — Z87891 Personal history of nicotine dependence: Secondary | ICD-10-CM | POA: Diagnosis not present

## 2016-05-12 DIAGNOSIS — J45909 Unspecified asthma, uncomplicated: Secondary | ICD-10-CM | POA: Insufficient documentation

## 2016-05-12 DIAGNOSIS — R208 Other disturbances of skin sensation: Secondary | ICD-10-CM

## 2016-05-12 DIAGNOSIS — Z7951 Long term (current) use of inhaled steroids: Secondary | ICD-10-CM | POA: Diagnosis not present

## 2016-05-12 DIAGNOSIS — R109 Unspecified abdominal pain: Secondary | ICD-10-CM | POA: Diagnosis present

## 2016-05-12 DIAGNOSIS — R0789 Other chest pain: Secondary | ICD-10-CM | POA: Diagnosis not present

## 2016-05-12 HISTORY — DX: Crohn's disease, unspecified, without complications: K50.90

## 2016-05-12 LAB — COMPREHENSIVE METABOLIC PANEL
ALK PHOS: 54 U/L (ref 38–126)
ALT: 20 U/L (ref 14–54)
AST: 27 U/L (ref 15–41)
Albumin: 4.1 g/dL (ref 3.5–5.0)
Anion gap: 10 (ref 5–15)
BILIRUBIN TOTAL: 0.8 mg/dL (ref 0.3–1.2)
BUN: 12 mg/dL (ref 6–20)
CALCIUM: 8.9 mg/dL (ref 8.9–10.3)
CO2: 29 mmol/L (ref 22–32)
CREATININE: 0.74 mg/dL (ref 0.44–1.00)
Chloride: 98 mmol/L — ABNORMAL LOW (ref 101–111)
GFR calc non Af Amer: >60 mL/min (ref >60)
GLUCOSE: 95 mg/dL (ref 65–99)
Potassium: 2.6 mmol/L — CL (ref 3.5–5.1)
SODIUM: 137 mmol/L (ref 135–145)
TOTAL PROTEIN: 6.7 g/dL (ref 6.5–8.1)

## 2016-05-12 LAB — URINALYSIS COMPLETE WITH MICROSCOPIC (ARMC ONLY)
BILIRUBIN URINE: NEGATIVE
Glucose, UA: NEGATIVE mg/dL
Leukocytes, UA: NEGATIVE
Nitrite: NEGATIVE
PH: 5 (ref 5.0–8.0)
Protein, ur: NEGATIVE mg/dL
Specific Gravity, Urine: 1.02 (ref 1.005–1.030)

## 2016-05-12 LAB — MAGNESIUM: MAGNESIUM: 1.8 mg/dL (ref 1.7–2.4)

## 2016-05-12 LAB — CBC
HCT: 44 % (ref 35.0–47.0)
Hemoglobin: 15.7 g/dL (ref 12.0–16.0)
MCH: 34.5 pg — AB (ref 26.0–34.0)
MCHC: 35.6 g/dL (ref 32.0–36.0)
MCV: 96.8 fL (ref 80.0–100.0)
PLATELETS: 123 10*3/uL — AB (ref 150–440)
RBC: 4.55 MIL/uL (ref 3.80–5.20)
RDW: 13.5 % (ref 11.5–14.5)
WBC: 4.6 10*3/uL (ref 3.6–11.0)

## 2016-05-12 LAB — LIPASE, BLOOD: Lipase: 22 U/L (ref 11–51)

## 2016-05-12 MED ORDER — ONDANSETRON 4 MG PO TBDP
ORAL_TABLET | ORAL | Status: AC
  Filled 2016-05-12: qty 1

## 2016-05-12 MED ORDER — POTASSIUM CHLORIDE CRYS ER 20 MEQ PO TBCR: 40.0000 meq | EXTENDED_RELEASE_TABLET | Freq: Once | ORAL | Status: DC

## 2016-05-12 MED ORDER — MORPHINE SULFATE (PF) 4 MG/ML IV SOLN
4.0000 mg | Freq: Once | INTRAVENOUS | Status: AC
  Administered 2016-05-12: 4 mg via INTRAVENOUS
  Filled 2016-05-12: qty 1

## 2016-05-12 MED ORDER — ONDANSETRON 4 MG PO TBDP
4.0000 mg | ORAL_TABLET | Freq: Once | ORAL | Status: AC | PRN
  Administered 2016-05-12: 4 mg via ORAL

## 2016-05-12 MED ORDER — HEPARIN SOD (PORK) LOCK FLUSH 100 UNIT/ML IV SOLN
500.0000 [IU] | Freq: Once | INTRAVENOUS | Status: AC
  Administered 2016-05-12: 500 [IU] via INTRAVENOUS
  Filled 2016-05-12: qty 5

## 2016-05-12 MED ORDER — POTASSIUM CHLORIDE 20 MEQ PO PACK
40.0000 meq | PACK | ORAL | Status: AC
  Administered 2016-05-12: 40 meq via ORAL
  Filled 2016-05-12: qty 2

## 2016-05-12 MED ORDER — LIDOCAINE 5 % EX PTCH
1.0000 | MEDICATED_PATCH | CUTANEOUS | Status: DC
  Administered 2016-05-12: 1 via TRANSDERMAL
  Filled 2016-05-12: qty 1

## 2016-05-12 MED ORDER — ONDANSETRON 4 MG PO TBDP: ORAL_TABLET | ORAL | 0 refills | Status: AC

## 2016-05-12 MED ORDER — LIDOCAINE 5 % EX PTCH: 1.0000 | MEDICATED_PATCH | Freq: Two times a day (BID) | CUTANEOUS | 0 refills | Status: DC

## 2016-05-12 MED ORDER — ONDANSETRON HCL 4 MG/2ML IJ SOLN
4.0000 mg | INTRAMUSCULAR | Status: AC
  Administered 2016-05-12: 4 mg via INTRAVENOUS
  Filled 2016-05-12: qty 2

## 2016-05-12 NOTE — ED Provider Notes (Signed)
[facility] Emergency Department Provider Note  ____________________________________________   First MD Initiated Contact with Patient 05/12/16 2128     (approximate)  I have reviewed the triage vital signs and the nursing notes.   HISTORY  Chief Complaint Flank Pain and Emesis    HPI Madeline Yates is a 55 y.o. female who has an extensive history of chronic pain from various sources including Crohn's disease status post primary ileostomy, chronic neuropathic pain, chronic neck pain, and chronic back pain with a stimulator, and who has a Port-A-Cath in place.  She presents for evaluation of what she initially stated was acute onset severe left sided pain and her chest wall or her abdomen.  She states that his been present since this morning and accompanied with nausea and one episode of vomiting.  Review of her medical records reveals that she was seen 5 days ago in the emergency department for similar pain although it was described as pain in the left chest radiating around to the axilla.  She was discharged after an extensive workup that included a CTA which ruled out home and her embolism and any other emergent medical condition.  She then followed up with her primary, Dr. Laroy Apple, who clearly documented page along her left rib cage in the same distribution she is describing tonight.  She denies chest pain, shortness of breath, fever/chills.  She reports that the pain is severe and nothing helps and is worse than her chronic pain.  She reports that she still has pain medication at home prescribed by Dr. Laroy Apple.    Past Medical History:  Diagnosis Date  . Allergy   . Anemia   . Arthritis   . Asthma   . Crohn disease (HCC)   . Depression   . Emphysema lung (HCC)   . Hypertension   . Seizure (HCC)   . Thyroid disease     Patient Active Problem List   Diagnosis Date Noted  . Periodic limb movement disorder (PLMD) 05/08/2016  . Thrombocytopenia (HCC)  05/08/2016  . COPD (chronic obstructive pulmonary disease) (HCC) 05/02/2016  . Emphysema lung (HCC) 04/23/2016  . Depression 04/23/2016  . Hypothyroidism 04/23/2016  . Crohn's disease (HCC) 04/23/2016  . Hypertension 04/23/2016  . Chronic respiratory failure (HCC) 04/23/2016  . On home O2 04/23/2016  . Port-a-cath in place 04/23/2016  . Neuropathic pain 04/23/2016  . Anemia 04/23/2016  . Ileostomy in place [facility]) 04/23/2016  . GERD (gastroesophageal reflux disease) 04/23/2016  . Restless leg syndrome 04/23/2016  . Chronic neck and back pain 04/23/2016    Past Surgical History:  Procedure Laterality Date  . ABDOMINAL HYSTERECTOMY    . APPENDECTOMY    . BREAST SURGERY    . CARPAL TUNNEL RELEASE    . CHOLECYSTECTOMY    . HIP ADDUCTOR TENOTOMY  2013   Left hip partial 96 total 99  . IMPLANTATION / PLACEMENT EPIDURAL NEUROSTIMULATOR ELECTRODES    . infusaport      smartport  . ROTATOR CUFF REPAIR    . total proct cloectomy  1992  . ULNAR NERVE REPAIR      Prior to Admission medications   Medication Sig Start Date End Date Taking? Authorizing Provider  albuterol-ipratropium (COMBIVENT) 18-103 MCG/ACT inhaler Inhale 2 puffs into the lungs every 4 (four) hours.    Historical Provider, MD  budesonide (PULMICORT) 0.5 MG/2ML nebulizer solution Take 2 mLs (0.5 mg total) by nebulization 2 (two) times daily. 05/02/16   Erin Fulling, MD  clonazePAM (KLONOPIN) 0.5 MG tablet Take 1 tablet (0.5 mg total) by mouth 2 (two) times daily as needed for anxiety. 04/23/16   Amy Rusty Aus, NP  diphenoxylate-atropine (LOMOTIL) 2.5-0.025 MG tablet Take by mouth 4 (four) times daily as needed for diarrhea or loose stools.    Historical Provider, MD  estradiol (ESTRACE) 0.5 MG tablet Take 1 mg by mouth daily.    Historical Provider, MD  estradiol (ESTRACE) 1 MG tablet Take 1 mg by mouth daily. 04/20/16   Historical Provider, MD  fluticasone (FLONASE) 50 MCG/ACT nasal spray Place into both nostrils daily.     Historical Provider, MD  formoterol (PERFOROMIST) 20 MCG/2ML nebulizer solution Take 2 mLs (20 mcg total) by nebulization 2 (two) times daily. 05/02/16   Erin Fulling, MD  guaiFENesin-codeine 100-10 MG/5ML syrup Take 5 mLs by mouth every 4 (four) hours as needed for cough. 05/02/16   Erin Fulling, MD  hydrochlorothiazide (HYDRODIURIL) 25 MG tablet Take 25 mg by mouth daily.    Historical Provider, MD  HYDROcodone-acetaminophen (NORCO/VICODIN) 5-325 MG tablet Take 1 tablet by mouth every 6 (six) hours as needed for moderate pain. 05/08/16   Amy Rusty Aus, NP  lamiVUDine-zidovudine (COMBIVIR) 150-300 MG tablet Take 1 tablet by mouth 2 (two) times daily.    Historical Provider, MD  levothyroxine (LEVOTHROID) 25 MCG tablet Take 0.5 tablets (12.5 mcg total) by mouth daily before breakfast. 04/24/16   Amy Lauren Krebs, NP  lidocaine (LIDODERM) 5 % Place 1 patch onto the skin every 12 (twelve) hours. Remove & Discard patch within 12 hours or as directed by MD 05/12/16 05/12/17  Loleta Rose, MD  Melatonin 5 MG TABS Take by mouth.    Historical Provider, MD  omeprazole (PRILOSEC) 20 MG capsule Take 1 capsule (20 mg total) by mouth daily. 04/23/16   Amy Rusty Aus, NP  ondansetron (ZOFRAN ODT) 4 MG disintegrating tablet Allow 1-2 tablets to dissolve in your mouth every 8 hours as needed for nausea/vomiting 05/12/16   Loleta Rose, MD  pediatric multivitamin-iron (POLY-VI-SOL WITH IRON) 15 MG chewable tablet Chew 1 tablet by mouth daily.    Historical Provider, MD  potassium chloride 20 MEQ/15ML (10%) SOLN Take 15 mLs (20 mEq total) by mouth 2 (two) times daily. 04/24/16   Amy Rusty Aus, NP  pregabalin (LYRICA) 100 MG capsule Take 1 capsule (100 mg total) by mouth 3 (three) times daily. 04/23/16   Amy Rusty Aus, NP  rOPINIRole (REQUIP) 1 MG tablet Take 1 tablet (1 mg total) by mouth 2 (two) times daily. 05/08/16   Amy Rusty Aus, NP  sertraline (ZOLOFT) 50 MG tablet Take 0.5 tablets (25 mg total) by mouth 2  (two) times daily. Pt takes 1/2 of 50 mg tab twice daily 04/23/16   Amy Rusty Aus, NP  silver nitrate applicators 75-25 % applicator Apply topically 3 (three) times a week. 04/23/16   Amy Rusty Aus, NP  valACYclovir (VALTREX) 500 MG tablet Take 500 mg by mouth daily. 04/02/16   Historical Provider, MD    Allergies Phenergan [promethazine hcl]; Gel clean [thimerosal]; Toradol [ketorolac tromethamine]; and Ultram [tramadol hcl]  Family History  Problem Relation Age of Onset  . Depression Mother     Social History Social History  Substance Use Topics  . Smoking status: Former Smoker    Packs/day: 2.00    Years: 23.00  . Smokeless tobacco: Never Used     Comment: quit smoking in 1999  . Alcohol use No  Comment: occ    Review of Systems Constitutional: No fever/chills Eyes: No visual changes. ENT: No sore throat. Cardiovascular: Denies chest pain. Respiratory: Denies shortness of breath. Gastrointestinal: No abdominal pain.  No nausea, no vomiting.  No diarrhea.  No constipation. Genitourinary: Negative for dysuria. Musculoskeletal: Negative for back pain. Skin: Negative for rash. Neurological: Negative for headaches, focal weakness or numbness.  10-point ROS otherwise negative.  ____________________________________________   PHYSICAL EXAM:  VITAL SIGNS: ED Triage Vitals  Enc Vitals Group     BP 05/12/16 2037 133/85     Pulse Rate 05/12/16 2037 77     Resp 05/12/16 2037 18     Temp 05/12/16 2037 98.2 F (36.8 C)     Temp src --      SpO2 05/12/16 2037 99 %     Weight 05/12/16 2037 172 lb (78 kg)     Height 05/12/16 2037  (1.549 m)     Head Circumference --      Peak Flow --      Pain Score 05/12/16 2038 8     Pain Loc --      Pain Edu? --      Excl. in GC? --     Constitutional: Alert and oriented. Well appearing and in no acute distress. Moaning in pain but speaking in complete sentences when answering questions. Eyes: Conjunctivae are normal.  PERRL. EOMI. Head: Atraumatic. Nose: No congestion/rhinnorhea. Mouth/Throat: Mucous membranes are moist.  Oropharynx non-erythematous. Neck: No stridor.  No meningeal signs.   Cardiovascular: Normal rate, regular rhythm. Good peripheral circulation. Grossly normal heart sounds.  Port-A-Cath in place Respiratory: Normal respiratory effort.  No retractions. Lungs CTAB. Gastrointestinal: Soft and nontender. No distention.  Musculoskeletal: No lower extremity tenderness nor edema. No gross deformities of extremities. Neurologic:  Normal speech and language. No gross focal neurologic deficits are appreciated.  Skin:  Skin is warm, dry and intact. No rash noted.  She has severe tenderness to palpation to the point of qualifying his allodynia along the left side of her chest wall in a distribution that ranges from her left anterior chest wall around to the side and around to the left flank.  It does appear to follow a dermatomal distribution. Psychiatric: Mood and affect are normal. Speech and behavior are normal.  ____________________________________________   LABS (all labs ordered are listed, but only abnormal results are displayed)  Labs Reviewed  COMPREHENSIVE METABOLIC PANEL - Abnormal; Notable for the following:       Result Value   Potassium 2.6 (*)    Chloride 98 (*)    All other components within normal limits  CBC - Abnormal; Notable for the following:    MCH 34.5 (*)    Platelets 123 (*)    All other components within normal limits  URINALYSIS COMPLETEWITH MICROSCOPIC ([facility] ONLY) - Abnormal; Notable for the following:    Color, Urine YELLOW (*)    APPearance CLEAR (*)    Ketones, ur TRACE (*)    Hgb urine dipstick 1+ (*)    Bacteria, UA RARE (*)    Squamous Epithelial / LPF 0-5 (*)    All other components within normal limits  LIPASE, BLOOD  MAGNESIUM   ____________________________________________  EKG  ED ECG REPORT I, Banks Chaikin, the attending physician,  personally viewed and interpreted this ECG.  Date: 05/12/2016 EKG Time: 21:00 Rate: 64 Rhythm: normal sinus rhythm QRS Axis: normal Intervals: normal ST/T Wave abnormalities: normal Conduction Disturbances: none Narrative Interpretation:  unremarkable  ____________________________________________  RADIOLOGY   No results found.  ____________________________________________   PROCEDURES  Procedure(s) performed:   Procedures   ____________________________________________   INITIAL IMPRESSION / ASSESSMENT AND PLAN / ED COURSE  Pertinent labs & imaging results that were available during my care of the patient were reviewed by me and considered in my medical decision making (see chart for details).  The patient has had a recent extensive workup for similar pain and she has numerous sources of chronic pain.  Her labs are reassuring today except for a low potassium of 2.6.  However she has well-documented hypokalemia and is taking a supplement although she states she is only been taking it half as often as she is supposed to.  She is only had one episode of vomiting so I do not believe this is due to GI losses.  I am giving her 40 mEq of potassium by mouth today and in stress to her the importance of taking her 20 mEq twice a day once she gets home.  I explained to her that her workup is reassuring and no other imaging is indicated.  The dermatomal distribution of her severe tenderness to palpation/allodynia may be consistent with an early, not yet emergent herpes zoster flare up.  I explained to her to look for a rash that is consistent with shingles and to let her primary care doctor know if she sees any rash develop.  Currently there is no evidence of a rash.  She is getting some relief with a Lidoderm patch and I will write her a prescription.  She is also going to take her regular outpatient pain medicine.  I encouraged her to call Dr.  Laroy Apple in the morning to schedule follow-up  visit.  I gave my usual and customary return precautions.      ____________________________________________  FINAL CLINICAL IMPRESSION(S) / ED DIAGNOSES  Final diagnoses:  Left-sided chest wall pain  Allodynia  Hypokalemia     MEDICATIONS GIVEN DURING THIS VISIT:  Medications  lidocaine (LIDODERM) 5 % 1 patch (1 patch Transdermal Patch Applied 05/12/16 2225)  ondansetron (ZOFRAN-ODT) disintegrating tablet 4 mg (4 mg Oral Given 05/12/16 2042)  morphine 4 MG/ML injection 4 mg (4 mg Intravenous Given 05/12/16 2223)  potassium chloride (KLOR-CON) packet 40 mEq (40 mEq Oral Given 05/12/16 2305)  ondansetron (ZOFRAN) injection 4 mg (4 mg Intravenous Given 05/12/16 2304)     NEW OUTPATIENT MEDICATIONS STARTED DURING THIS VISIT:  New Prescriptions   LIDOCAINE (LIDODERM) 5 %    Place 1 patch onto the skin every 12 (twelve) hours. Remove & Discard patch within 12 hours or as directed by MD   ONDANSETRON (ZOFRAN ODT) 4 MG DISINTEGRATING TABLET    Allow 1-2 tablets to dissolve in your mouth every 8 hours as needed for nausea/vomiting      Note:  This document was prepared using Dragon voice recognition software and may include unintentional dictation errors.    Loleta Rose, MD 05/12/16 [phone]

## 2016-05-12 NOTE — ED Notes (Signed)
Drs York Cerise and Mayford Knife notified of potassium level as reported by lab.

## 2016-05-12 NOTE — Discharge Instructions (Signed)
As we discussed, your workup was reassuring.  Given the distribution of your pain and the way it moves in a line around your left side, keep an eye on it and see if you develop a rash; it is possible (though unlikely) that you may develop shingles in that area.  Please use the prescribed Lidoderm patches and use your regular pain medication.  It is also very important that you take your potassium twice daily as prescribed.  Follow up with Dr. Laroy Apple at the next available opportunity.    Return to the emergency department if you develop new or worsening symptoms that concern you.

## 2016-05-12 NOTE — ED Triage Notes (Signed)
Awoke with left flank/rib pain and nausea. Vomited once.

## 2016-05-13 ENCOUNTER — Other Ambulatory Visit: Payer: Self-pay | Admitting: *Deleted

## 2016-05-13 MED ORDER — FORMOTEROL FUMARATE 20 MCG/2ML IN NEBU: 20.0000 ug | INHALATION_SOLUTION | Freq: Two times a day (BID) | RESPIRATORY_TRACT | 3 refills | Status: AC

## 2016-05-15 ENCOUNTER — Telehealth: Payer: Self-pay | Admitting: *Deleted

## 2016-05-15 NOTE — Telephone Encounter (Signed)
Medicare will not pay for lidocaine patches for whatever reason. She can get them over the counter to try.  She can also try topical capsaicin cream or a capsaicin patch to the L flank painful are.  This is over the counter.

## 2016-05-15 NOTE — Telephone Encounter (Signed)
Patient aware as she is now purchasing some otc patches.

## 2016-05-15 NOTE — Telephone Encounter (Signed)
PA for lidocaine patch 5% has been denied. Patient went to ed on 05/12/16 and rx was given. Flank pain, neck pain or leg pain does not honor approval. Would you like to try anything else? Patient was given Norco on 05/08/16 at ov at [facility].

## 2016-05-17 ENCOUNTER — Other Ambulatory Visit: Payer: Self-pay | Admitting: Family Medicine

## 2016-05-17 ENCOUNTER — Telehealth: Payer: Self-pay | Admitting: Family Medicine

## 2016-05-17 ENCOUNTER — Ambulatory Visit
Admission: RE | Admit: 2016-05-17 | Discharge: 2016-05-17 | Disposition: A | Discharge status: Home / Self Care | Payer: Medicare Other | Source: Ambulatory Visit | Attending: Family Medicine | Admitting: Family Medicine

## 2016-05-17 ENCOUNTER — Ambulatory Visit (INDEPENDENT_AMBULATORY_CARE_PROVIDER_SITE_OTHER): Payer: Medicare Other | Admitting: Family Medicine

## 2016-05-17 VITALS — BP 128/61 | HR 75 | Temp 97.3°F | Resp 16 | Ht 61.0 in | Wt 174.0 lb

## 2016-05-17 DIAGNOSIS — R7989 Other specified abnormal findings of blood chemistry: Secondary | ICD-10-CM

## 2016-05-17 DIAGNOSIS — E876 Hypokalemia: Secondary | ICD-10-CM

## 2016-05-17 DIAGNOSIS — R109 Unspecified abdominal pain: Secondary | ICD-10-CM

## 2016-05-17 DIAGNOSIS — R208 Other disturbances of skin sensation: Secondary | ICD-10-CM

## 2016-05-17 DIAGNOSIS — R0789 Other chest pain: Secondary | ICD-10-CM

## 2016-05-17 LAB — BASIC METABOLIC PANEL WITH GFR
BUN: 13 mg/dL (ref 7–25)
CHLORIDE: 100 mmol/L (ref 98–110)
CO2: 26 mmol/L (ref 20–31)
Calcium: 9.1 mg/dL (ref 8.6–10.4)
Creat: 0.91 mg/dL (ref 0.50–1.05)
GFR, Est African American: 82 mL/min (ref >=60)
GFR, Est Non African American: 71 mL/min (ref >=60)
Glucose, Bld: 91 mg/dL (ref 65–99)
POTASSIUM: 4.1 mmol/L (ref 3.5–5.3)
SODIUM: 137 mmol/L (ref 135–146)

## 2016-05-17 MED ORDER — LIDOCAINE 3.75 % EX CREA: 1.0000 "application " | TOPICAL_CREAM | Freq: Three times a day (TID) | CUTANEOUS | 1 refills | Status: AC | PRN

## 2016-05-17 MED ORDER — HYDROCODONE-ACETAMINOPHEN 5-325 MG PO TABS: 1.0000 | ORAL_TABLET | Freq: Four times a day (QID) | ORAL | 0 refills | Status: AC | PRN

## 2016-05-17 NOTE — Telephone Encounter (Signed)
Reviewed chest Xr with patient.

## 2016-05-17 NOTE — Progress Notes (Signed)
Subjective:    Patient ID: Madeline Yates, female    DOB: 12/15/1960, 55 y.o.   MRN: [id number]  HPI: Madeline Yates is a 55 y.o. female presenting on 05/17/2016 for Back Pain (pain in upper extremities)   HPI  Pt presents for L flank pain. Pain has been present around the L flank since 7/24 when she was seen in ER. Pain starts on the back and radiates around to her front below the rib cage. CTA was negative for PE. Seen in ER on 7/30- thought to be possible shingles. No rash has appeared at this time. Pt is tender to touch. Describes pain as 10/10. Painful with deep inspiration.   Past Medical History:  Diagnosis Date  . Allergy   . Anemia   . Arthritis   . Asthma   . Crohn disease (HCC)   . Depression   . Emphysema lung (HCC)   . Hypertension   . Seizure (HCC)   . Thyroid disease     Current Outpatient Prescriptions on File Prior to Visit  Medication Sig  . albuterol-ipratropium (COMBIVENT) 18-103 MCG/ACT inhaler Inhale 2 puffs into the lungs every 4 (four) hours.  . budesonide (PULMICORT) 0.5 MG/2ML nebulizer solution Take 2 mLs (0.5 mg total) by nebulization 2 (two) times daily.  . clonazePAM (KLONOPIN) 0.5 MG tablet Take 1 tablet (0.5 mg total) by mouth 2 (two) times daily as needed for anxiety.  . diphenoxylate-atropine (LOMOTIL) 2.5-0.025 MG tablet Take by mouth 4 (four) times daily as needed for diarrhea or loose stools.  Marland Kitchen estradiol (ESTRACE) 0.5 MG tablet Take 1 mg by mouth daily.  Marland Kitchen estradiol (ESTRACE) 1 MG tablet Take 1 mg by mouth daily.  . fluticasone (FLONASE) 50 MCG/ACT nasal spray Place into both nostrils daily.  . formoterol (PERFOROMIST) 20 MCG/2ML nebulizer solution Take 2 mLs (20 mcg total) by nebulization 2 (two) times daily.  Marland Kitchen guaiFENesin-codeine 100-10 MG/5ML syrup Take 5 mLs by mouth every 4 (four) hours as needed for cough.  . hydrochlorothiazide (HYDRODIURIL) 25 MG tablet Take 25 mg by mouth daily.  Marland Kitchen lamiVUDine-zidovudine (COMBIVIR) 150-300 MG tablet  Take 1 tablet by mouth 2 (two) times daily.  Marland Kitchen levothyroxine (LEVOTHROID) 25 MCG tablet Take 0.5 tablets (12.5 mcg total) by mouth daily before breakfast.  . Melatonin 5 MG TABS Take by mouth.  Marland Kitchen omeprazole (PRILOSEC) 20 MG capsule Take 1 capsule (20 mg total) by mouth daily.  . ondansetron (ZOFRAN ODT) 4 MG disintegrating tablet Allow 1-2 tablets to dissolve in your mouth every 8 hours as needed for nausea/vomiting  . pediatric multivitamin-iron (POLY-VI-SOL WITH IRON) 15 MG chewable tablet Chew 1 tablet by mouth daily.  . potassium chloride 20 MEQ/15ML (10%) SOLN Take 15 mLs (20 mEq total) by mouth 2 (two) times daily.  . pregabalin (LYRICA) 100 MG capsule Take 1 capsule (100 mg total) by mouth 3 (three) times daily.  Marland Kitchen rOPINIRole (REQUIP) 1 MG tablet Take 1 tablet (1 mg total) by mouth 2 (two) times daily.  . sertraline (ZOLOFT) 50 MG tablet Take 0.5 tablets (25 mg total) by mouth 2 (two) times daily. Pt takes 1/2 of 50 mg tab twice daily  . silver nitrate applicators 75-25 % applicator Apply topically 3 (three) times a week.  . valACYclovir (VALTREX) 500 MG tablet Take 500 mg by mouth daily.   No current facility-administered medications on file prior to visit.     Review of Systems  Constitutional: Negative for chills and fever.  HENT: Negative.  Respiratory: Negative for cough, chest tightness and wheezing.   Cardiovascular: Negative for chest pain and leg swelling.  Gastrointestinal: Positive for abdominal pain. Negative for constipation, diarrhea, nausea and vomiting.  Endocrine: Negative.  Negative for cold intolerance, heat intolerance, polydipsia, polyphagia and polyuria.  Genitourinary: Positive for flank pain. Negative for difficulty urinating and dysuria.  Neurological: Negative for dizziness, light-headedness and numbness.  Psychiatric/Behavioral: Negative.   All other systems reviewed and are negative.  Per HPI unless specifically indicated above     Objective:    BP  128/61 (BP Location: Left Arm, Patient Position: Sitting, Cuff Size: Normal)   Pulse 75   Temp 97.3 F (36.3 C) (Oral)   Resp 16   Ht 5\' 1"  (1.549 m)   Wt 174 lb (78.9 kg)   LMP  (Exact Date)   SpO2 99%   BMI 32.88 kg/m   Wt Readings from Last 3 Encounters:  05/17/16 174 lb (78.9 kg)  05/12/16 172 lb (78 kg)  05/08/16 172 lb 12.8 oz (78.4 kg)    Physical Exam  Constitutional: She is oriented to person, place, and time. She appears well-developed and well-nourished.  HENT:  Head: Normocephalic and atraumatic.  Neck: Neck supple.  Cardiovascular: Normal rate, regular rhythm and normal heart sounds.  Exam reveals no gallop and no friction rub.   No murmur heard. Pulmonary/Chest: Effort normal and breath sounds normal. She has no decreased breath sounds. She has no wheezes. She has no rhonchi. She has no rales. Chest wall is not dull to percussion. She exhibits tenderness. She exhibits no mass and no bony tenderness.    Abdominal: Soft. Normal appearance and bowel sounds are normal. She exhibits no distension and no mass. There is no tenderness. There is no rebound and no guarding.  Musculoskeletal: Normal range of motion. She exhibits no edema or tenderness.  Lymphadenopathy:    She has no cervical adenopathy.  Neurological: She is alert and oriented to person, place, and time.  Skin: Skin is warm and dry. No rash noted. No pallor.   Results for orders placed or performed during the hospital encounter of 05/12/16  Lipase, blood  Result Value Ref Range   Lipase 22 11 - 51 U/L  Comprehensive metabolic panel  Result Value Ref Range   Sodium 137 135 - 145 mmol/L   Potassium 2.6 (LL) 3.5 - 5.1 mmol/L   Chloride 98 (L) 101 - 111 mmol/L   CO2 29 22 - 32 mmol/L   Glucose, Bld 95 65 - 99 mg/dL   BUN 12 6 - 20 mg/dL   Creatinine, Ser 1.19 0.44 - 1.00 mg/dL   Calcium 8.9 8.9 - 14.7 mg/dL   Total Protein 6.7 6.5 - 8.1 g/dL   Albumin 4.1 3.5 - 5.0 g/dL   AST 27 15 - 41 U/L   ALT 20  14 - 54 U/L   Alkaline Phosphatase 54 38 - 126 U/L   Total Bilirubin 0.8 0.3 - 1.2 mg/dL   GFR calc non Af Amer >60 >60 mL/min   GFR calc Af Amer >60 >60 mL/min   Anion gap 10 5 - 15  CBC  Result Value Ref Range   WBC 4.6 3.6 - 11.0 K/uL   RBC 4.55 3.80 - 5.20 MIL/uL   Hemoglobin 15.7 12.0 - 16.0 g/dL   HCT 82.9 56.2 - 13.0 %   MCV 96.8 80.0 - 100.0 fL   MCH 34.5 (H) 26.0 - 34.0 pg   MCHC 35.6 32.0 -  36.0 g/dL   RDW 42.7 06.2 - 37.6 %   Platelets 123 (L) 150 - 440 K/uL  Urinalysis complete, with microscopic  Result Value Ref Range   Color, Urine YELLOW (A) YELLOW   APPearance CLEAR (A) CLEAR   Glucose, UA NEGATIVE NEGATIVE mg/dL   Bilirubin Urine NEGATIVE NEGATIVE   Ketones, ur TRACE (A) NEGATIVE mg/dL   Specific Gravity, Urine 1.020 1.005 - 1.030   Hgb urine dipstick 1+ (A) NEGATIVE   pH 5.0 5.0 - 8.0   Protein, ur NEGATIVE NEGATIVE mg/dL   Nitrite NEGATIVE NEGATIVE   Leukocytes, UA NEGATIVE NEGATIVE   RBC / HPF 0-5 0 - 5 RBC/hpf   WBC, UA 0-5 0 - 5 WBC/hpf   Bacteria, UA RARE (A) NONE SEEN   Squamous Epithelial / LPF 0-5 (A) NONE SEEN   Mucous PRESENT   Magnesium  Result Value Ref Range   Magnesium 1.8 1.7 - 2.4 mg/dL      Assessment & Plan:   Problem List Items Addressed This Visit    None    Visit Diagnoses    Allodynia    -  Primary   Agree with ER assessment that pain could be zoster related- however no rash. Zoster IGG obtained. Lidocaine cream PRN to help with pain.    Relevant Medications   Lidocaine 3.75 % CREA   HYDROcodone-acetaminophen (NORCO/VICODIN) 5-325 MG tablet   Other Relevant Orders   DG Chest 2 View (Completed)   Left-sided chest wall pain       CXR to r/o cardiopulmonary disease. Trial of lidocaine. Pt requesting vicodin refill- have refilled for 5 days only. Chronic opioids will not be given at this office. We will continue to treat acute pain only.  Reviewed return precautions and alarm symptoms.       Meds ordered this encounter    Medications  . Lidocaine 3.75 % CREA    Sig: Apply 1 application topically 3 (three) times daily as needed.    Dispense:  60 g    Refill:  1    Order Specific Question:   Supervising Provider    Answer:   Janeann Forehand [[id number]]  . HYDROcodone-acetaminophen (NORCO/VICODIN) 5-325 MG tablet    Sig: Take 1 tablet by mouth every 6 (six) hours as needed for moderate pain.    Dispense:  20 tablet    Refill:  0    Order Specific Question:   Supervising Provider    Answer:   Janeann Forehand [[id number]]      Follow up plan: Return if symptoms worsen or fail to improve.

## 2016-05-17 NOTE — Patient Instructions (Addendum)
We will get a chest XR to make sure nothing else is wrong.   Try applying lidocaine cream to help with flank pain. Also try the capsaicin cream topically to the area.   I will contact you with lab results regarding your zoster check.

## 2016-05-18 LAB — D-DIMER, QUANTITATIVE (NOT AT ARMC): D DIMER QUANT: 0.3 ug{FEU}/mL (ref <0.50)

## 2016-05-20 LAB — VARICELLA ZOSTER ANTIBODY, IGG: Varicella IgG: 522.3 Index — ABNORMAL HIGH (ref <135.00)

## 2016-05-21 ENCOUNTER — Encounter: Payer: Self-pay | Admitting: Family Medicine

## 2016-05-21 ENCOUNTER — Other Ambulatory Visit: Payer: Self-pay | Admitting: Family Medicine

## 2016-05-21 MED ORDER — VALACYCLOVIR HCL 1 G PO TABS: 1000.0000 mg | ORAL_TABLET | Freq: Three times a day (TID) | ORAL | 0 refills | Status: AC

## 2016-05-22 ENCOUNTER — Ambulatory Visit: Payer: Medicare Other | Admitting: Family Medicine

## 2016-06-05 ENCOUNTER — Ambulatory Visit: Payer: Medicare Other | Admitting: Gastroenterology

## 2016-06-06 ENCOUNTER — Ambulatory Visit: Payer: Medicare Other | Admitting: Family Medicine

## 2016-06-21 ENCOUNTER — Telehealth: Payer: Self-pay | Admitting: Internal Medicine

## 2016-06-21 NOTE — Telephone Encounter (Signed)
Informed Madeline Yates with [facility] that I received the paperwork and once DK comes into the office next week I will have him to sign and fax it back. Nothing further needed.

## 2016-06-21 NOTE — Telephone Encounter (Signed)
Kristi from [facility]  Asking about a fax she is sending over About oxygen re-certification  She states she fax one over on 06/12/16 Please call once we receive

## 2017-08-19 IMAGING — CT CT ANGIO CHEST
1 of 3 series · 14 of 31 positions shown · IV contrast (isovue)
Comparison: Chest radiograph dated 05/06/2016

CLINICAL DATA: 54-year-old female with Crohn's disease status post
colon resection presenting with central chest pain.

EXAM:
CT ANGIOGRAPHY CHEST WITH CONTRAST
TECHNIQUE: Multidetector CT imaging of the chest was performed using the
standard protocol during bolus administration of intravenous
contrast. Multiplanar CT image reconstructions and MIPs were
obtained to evaluate the vascular anatomy.
CONTRAST:  100 cc Isovue 370

[Series 7: pe 1.0 thins mpr · axial · 0.64mm/px · z∈[-334,-70]mm · 14 of 513 slices shown]
[im 37/513  lung]
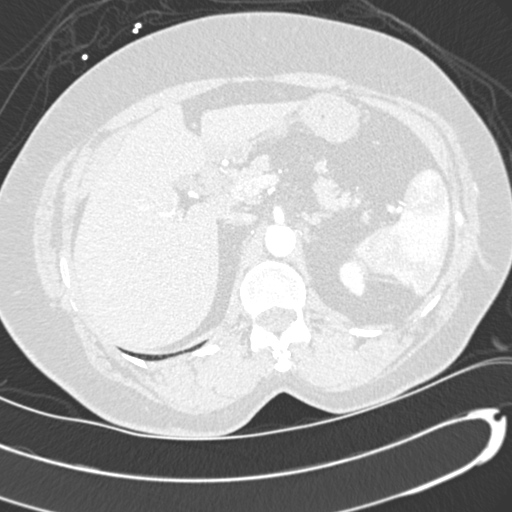
[im 74/513  mediastinal]
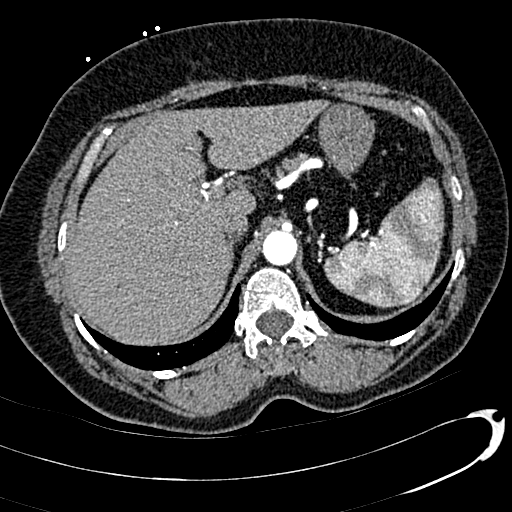
[im 110/513  lung]
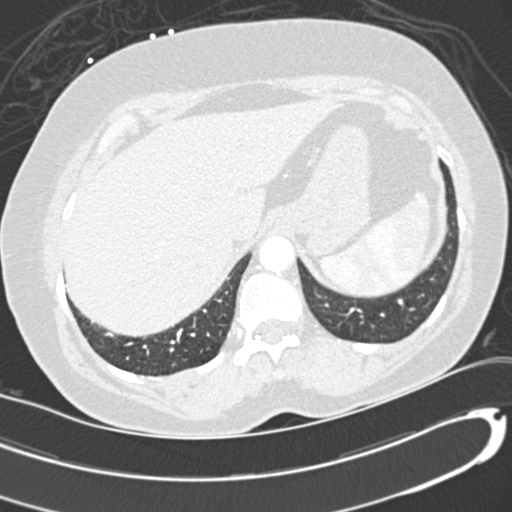
[im 147/513  mediastinal]
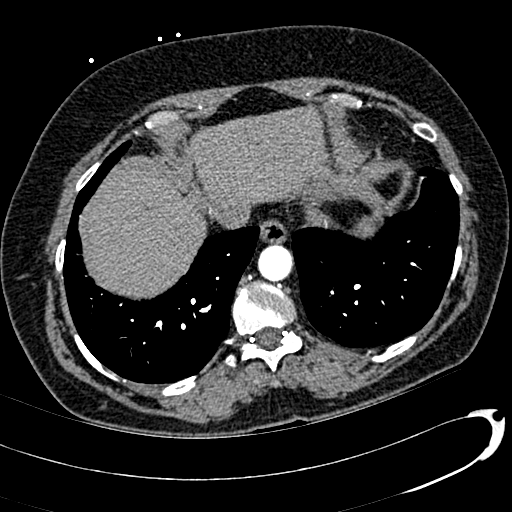
[im 183/513  lung]
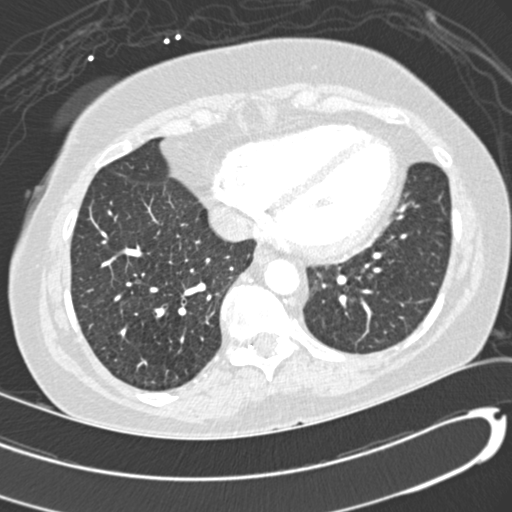
[im 220/513  mediastinal]
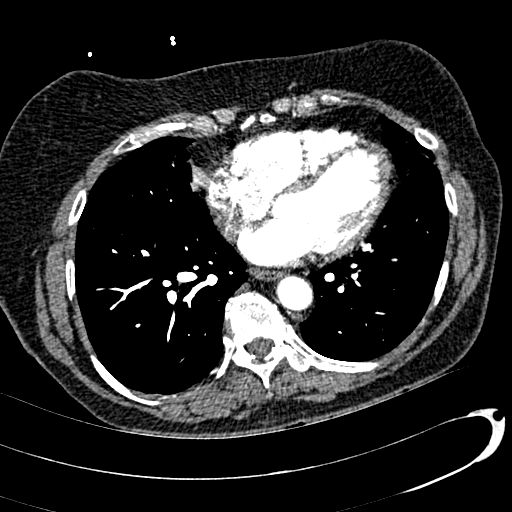
[im 241/513  lung]
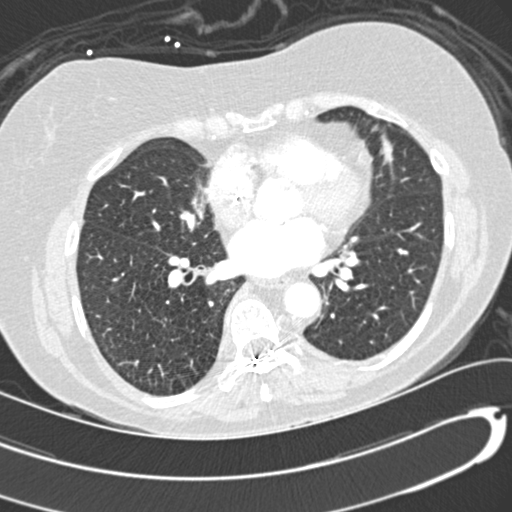
[im 257/513  mediastinal]
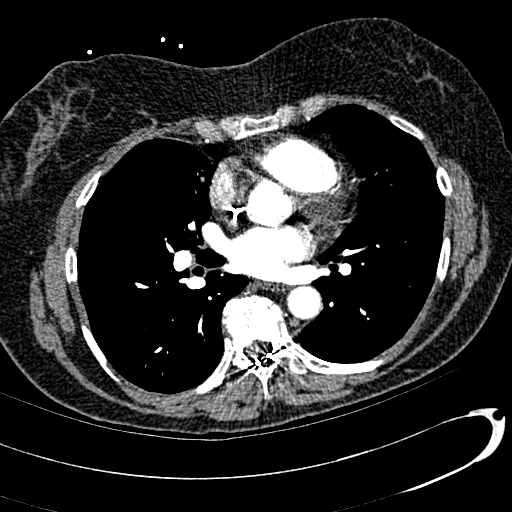
[im 293/513  lung]
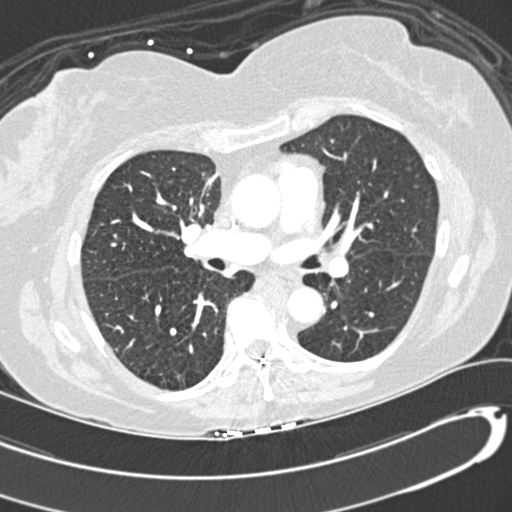
[im 330/513  mediastinal]
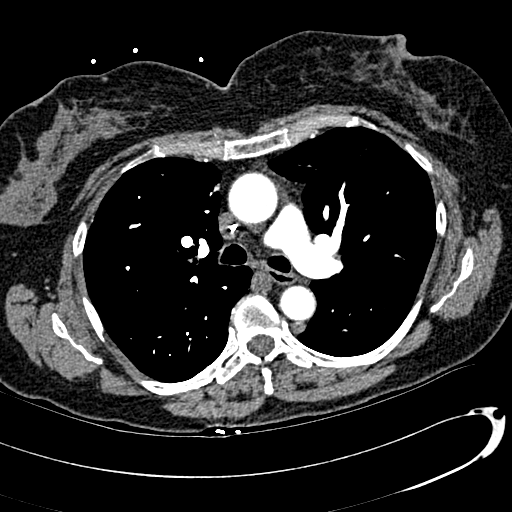
[im 366/513  lung]
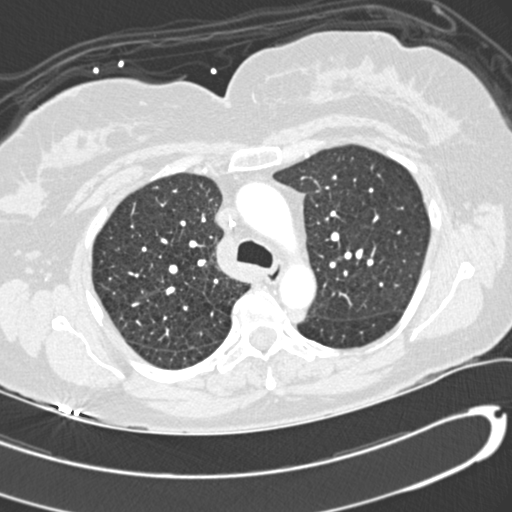
[im 403/513  mediastinal]
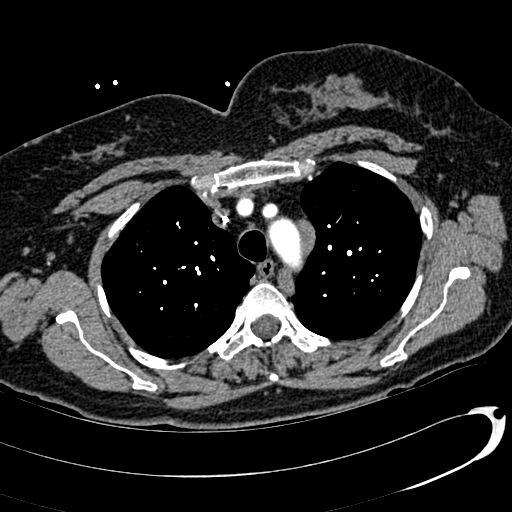
[im 439/513  lung]
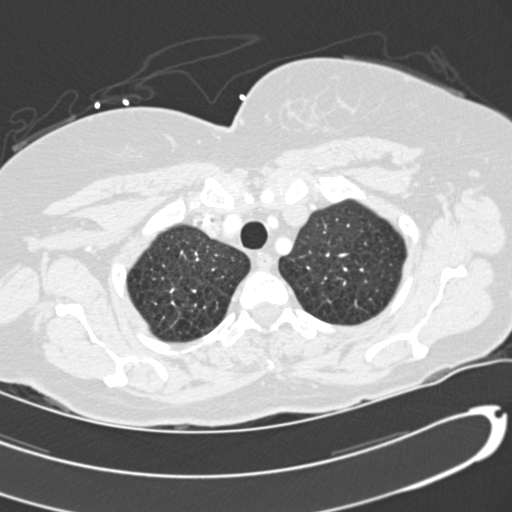
[im 476/513  mediastinal]
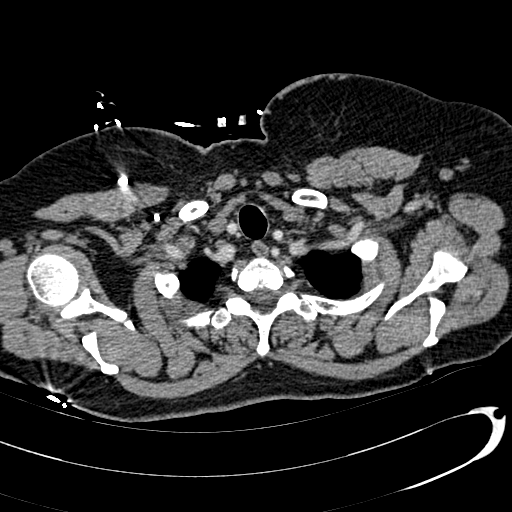

[14 of 31 positions shown; findings below may reference images not displayed]

FINDINGS: There is emphysematous changes of the lungs. Linear densities in the
right middle lobe and lingula likely represent atelectasis or
scarring. There is no focal consolidation. No pleural effusion or
pneumothorax. The central airways are patent.

The thoracic aorta appears unremarkable. The origins of the great
vessels of the aortic arch appear patent. There is no CT evidence of
pulmonary embolism. There is no cardiomegaly or pericardial
effusion. Right pectoral Port-A-Cath with tip in the right atrium.
There is no hilar or mediastinal adenopathy. The esophagus is
grossly unremarkable.

There is no axillary adenopathy. No supraclavicular adenopathy. The
chest wall soft tissues appear unremarkable. There is degenerative
changes of the spine. No acute fracture. Spinal stimulator wires
noted.

Cholecystectomy. The visualized upper abdomen is otherwise
unremarkable.

Review of the MIP images confirms the above findings.
IMPRESSION: No acute intrathoracic pathology. No CT evidence of pulmonary
embolism.
